# Patient Record
Sex: Male | Born: 1995 | Race: Black or African American | Hispanic: No | Marital: Single | Smoking: Never smoker
Health system: Southern US, Community
[De-identification: ages and names within clinical notes are randomized; demographics above are authoritative.]

## PROBLEM LIST (undated history)

## (undated) DIAGNOSIS — J45909 Unspecified asthma, uncomplicated: Secondary | ICD-10-CM

## (undated) DIAGNOSIS — D649 Anemia, unspecified: Secondary | ICD-10-CM

## (undated) DIAGNOSIS — K259 Gastric ulcer, unspecified as acute or chronic, without hemorrhage or perforation: Secondary | ICD-10-CM

## (undated) DIAGNOSIS — K269 Duodenal ulcer, unspecified as acute or chronic, without hemorrhage or perforation: Secondary | ICD-10-CM

## (undated) DIAGNOSIS — K219 Gastro-esophageal reflux disease without esophagitis: Secondary | ICD-10-CM

## (undated) DIAGNOSIS — K56609 Unspecified intestinal obstruction, unspecified as to partial versus complete obstruction: Secondary | ICD-10-CM

## (undated) DIAGNOSIS — K5010 Crohn's disease of large intestine without complications: Secondary | ICD-10-CM

## (undated) HISTORY — DX: Crohn's disease of large intestine without complications: K50.10

## (undated) HISTORY — DX: Gastric ulcer, unspecified as acute or chronic, without hemorrhage or perforation: K25.9

## (undated) HISTORY — DX: Unspecified intestinal obstruction, unspecified as to partial versus complete obstruction: K56.609

## (undated) HISTORY — DX: Gastro-esophageal reflux disease without esophagitis: K21.9

## (undated) HISTORY — DX: Duodenal ulcer, unspecified as acute or chronic, without hemorrhage or perforation: K26.9

## (undated) HISTORY — PX: COLON RESECTION: SHX5231

## (undated) HISTORY — DX: Anemia, unspecified: D64.9

## (undated) HISTORY — DX: Unspecified asthma, uncomplicated: J45.909

---
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
## 2017-01-22 DIAGNOSIS — K200 Eosinophilic esophagitis: Secondary | ICD-10-CM | POA: Insufficient documentation

## 2017-03-10 ENCOUNTER — Encounter: Payer: Self-pay | Admitting: Physician Assistant

## 2017-03-10 ENCOUNTER — Other Ambulatory Visit: Payer: Self-pay

## 2017-03-10 ENCOUNTER — Ambulatory Visit (INDEPENDENT_AMBULATORY_CARE_PROVIDER_SITE_OTHER): Payer: BC Managed Care – PPO | Admitting: Physician Assistant

## 2017-03-10 VITALS — BP 112/68 | HR 76 | Temp 98.0°F | Resp 16 | Ht 66.0 in | Wt 166.8 lb

## 2017-03-10 DIAGNOSIS — R0989 Other specified symptoms and signs involving the circulatory and respiratory systems: Secondary | ICD-10-CM

## 2017-03-10 DIAGNOSIS — Z8709 Personal history of other diseases of the respiratory system: Secondary | ICD-10-CM

## 2017-03-10 DIAGNOSIS — J4521 Mild intermittent asthma with (acute) exacerbation: Secondary | ICD-10-CM | POA: Diagnosis not present

## 2017-03-10 MED ORDER — ALBUTEROL SULFATE (2.5 MG/3ML) 0.083% IN NEBU: 2.5000 mg | INHALATION_SOLUTION | Freq: Four times a day (QID) | RESPIRATORY_TRACT | 1 refills | Status: DC | PRN

## 2017-03-10 MED ORDER — IPRATROPIUM BROMIDE 0.02 % IN SOLN
0.5000 mg | Freq: Once | RESPIRATORY_TRACT | Status: AC
  Administered 2017-03-10: 0.5 mg via RESPIRATORY_TRACT

## 2017-03-10 MED ORDER — ALBUTEROL SULFATE (2.5 MG/3ML) 0.083% IN NEBU
2.5000 mg | INHALATION_SOLUTION | Freq: Once | RESPIRATORY_TRACT | Status: AC
  Administered 2017-03-10: 2.5 mg via RESPIRATORY_TRACT

## 2017-03-10 MED ORDER — PREDNISONE 50 MG PO TABS: ORAL_TABLET | ORAL | 0 refills | Status: DC

## 2017-03-10 NOTE — Progress Notes (Signed)
03/10/2017 10:32 AM   DOB: 1996-01-10 / MRN: [id number]  SUBJECTIVE:  Jacob Mack is a 22 y.o. male presenting for cough, nasal congestion for the last 3 days.  I had recently seen his brother in the clinic for similar symptoms.  Patient has history of asthma with no previous hospitalization.  Complains of some mild chest tightness along with the cough.  Has been using his albuterol nebulizer twice daily over the last 3 days with good relief of chest tightness.  He is a nebulizer at this time.  He is a never smoker.  He is allergic to augmentin [amoxicillin-pot clavulanate].   He  has a past medical history of Crohn's colitis ([place]).    He  reports that  has never smoked. he has never used smokeless tobacco. He reports that he does not drink alcohol or use drugs. He  has no sexual activity history on file. The patient  has no past surgical history on file.  His family history includes Diabetes in his father and mother.  Review of Systems  Constitutional: Negative for chills, diaphoresis and fever.  HENT: Negative for sore throat.   Respiratory: Positive for cough and shortness of breath. Negative for hemoptysis, sputum production and wheezing.   Cardiovascular: Negative for chest pain, orthopnea and leg swelling.  Gastrointestinal: Negative for nausea.  Skin: Negative for rash.  Neurological: Negative for dizziness.    The problem list and medications were reviewed and updated by myself where necessary and exist elsewhere in the encounter.   OBJECTIVE:  BP 112/68 (BP Location: Right Arm, Patient Position: Sitting, Cuff Size: Normal)   Pulse 76   Temp 98 F (36.7 C) (Oral)   Resp 16   Ht 5' 6"  (1.676 m)   Wt 166 lb 12.8 oz (75.7 kg)   SpO2 94%   PF 300 L/min   BMI 26.92 kg/m   Physical Exam  Constitutional: He appears well-developed. He is active and cooperative.  Non-toxic appearance.  Cardiovascular: Normal rate, regular rhythm, S1 normal, S2 normal, normal heart sounds,  intact distal pulses and normal pulses. Exam reveals no gallop and no friction rub.  No murmur heard. Pulmonary/Chest: Effort normal. No stridor. No tachypnea. No respiratory distress. He has no wheezes. He has no rales.  Abdominal: He exhibits no distension.  Musculoskeletal: He exhibits no edema.  Neurological: He is alert.  Skin: Skin is warm and dry. He is not diaphoretic. No pallor.  Vitals reviewed.   No results found for this or any previous visit (from the past 72 hour(s)).  No results found.  ASSESSMENT AND PLAN:  Sasuke was seen today for cough.  Diagnoses and all orders for this visit:  Abnormally low peak expiratory flow rate: Despite negative wheezing on exam he has had a increase of roughly 150 L/min after nebs.  I will refill his nebs and start him on some prednisone.  He has no history of diabetes. -     albuterol (PROVENTIL) (2.5 MG/3ML) 0.083% nebulizer solution 2.5 mg -     ipratropium (ATROVENT) nebulizer solution 0.5 mg  History of asthma  Mild intermittent asthma with acute exacerbation -     predniSONE (DELTASONE) 50 MG tablet; Take one tablet daily for the next 4 days. -     albuterol (PROVENTIL) (2.5 MG/3ML) 0.083% nebulizer solution; Take 3 mLs (2.5 mg total) by nebulization every 6 (six) hours as needed for wheezing or shortness of breath.    The patient is advised to  call or return to clinic if he does not see an improvement in symptoms, or to seek the care of the closest emergency department if he worsens with the above plan.   Philis Fendt, MHS, PA-C [facility] 03/10/2017 10:32 AM

## 2017-03-10 NOTE — Patient Instructions (Addendum)
  You had an excellent response to the nebulizers in the office.  I think he would benefit from some prednisone.   IF you received an x-ray today, you will receive an invoice from [facility]. Please contact [facility] at [phone] with questions or concerns regarding your invoice.   IF you received labwork today, you will receive an invoice from [place]. Please contact [facility] at [phone] with questions or concerns regarding your invoice.   Our billing staff will not be able to assist you with questions regarding bills from these companies.  You will be contacted with the lab results as soon as they are available. The fastest way to get your results is to activate your My Chart account. Instructions are located on the last page of this paperwork. If you have not heard from [place] regarding the results in 2 weeks, please contact this office.

## 2017-06-25 ENCOUNTER — Encounter: Payer: BC Managed Care – PPO | Admitting: Physician Assistant

## 2017-08-09 ENCOUNTER — Ambulatory Visit: Payer: BC Managed Care – PPO | Admitting: Physician Assistant

## 2017-08-09 ENCOUNTER — Encounter: Payer: Self-pay | Admitting: Physician Assistant

## 2017-08-09 ENCOUNTER — Ambulatory Visit (INDEPENDENT_AMBULATORY_CARE_PROVIDER_SITE_OTHER): Payer: BC Managed Care – PPO

## 2017-08-09 ENCOUNTER — Other Ambulatory Visit: Payer: Self-pay

## 2017-08-09 VITALS — BP 115/78 | HR 51 | Temp 97.6°F | Ht 66.0 in | Wt 169.0 lb

## 2017-08-09 DIAGNOSIS — R0789 Other chest pain: Secondary | ICD-10-CM | POA: Diagnosis not present

## 2017-08-09 DIAGNOSIS — M791 Myalgia, unspecified site: Secondary | ICD-10-CM

## 2017-08-09 DIAGNOSIS — R001 Bradycardia, unspecified: Secondary | ICD-10-CM | POA: Diagnosis not present

## 2017-08-09 LAB — POCT URINALYSIS DIP (MANUAL ENTRY)
BILIRUBIN UA: NEGATIVE
BILIRUBIN UA: NEGATIVE mg/dL
GLUCOSE UA: NEGATIVE mg/dL
Leukocytes, UA: NEGATIVE
Nitrite, UA: NEGATIVE
Protein Ur, POC: NEGATIVE mg/dL
RBC UA: NEGATIVE
SPEC GRAV UA: 1.02 (ref 1.010–1.025)
Urobilinogen, UA: 0.2 E.U./dL
pH, UA: 6 (ref 5.0–8.0)

## 2017-08-09 LAB — POCT CBC
GRANULOCYTE PERCENT: 63.3 % (ref 37–80)
HCT, POC: 45.5 % (ref 43.5–53.7)
HEMOGLOBIN: 14.9 g/dL (ref 14.1–18.1)
Lymph, poc: 2.3 (ref 0.6–3.4)
MCH: 28.6 pg (ref 27–31.2)
MCHC: 32.8 g/dL (ref 31.8–35.4)
MCV: 87.4 fL (ref 80–97)
MID (CBC): 0.7 (ref 0–0.9)
MPV: 6.4 fL (ref 0–99.8)
POC GRANULOCYTE: 5.2 (ref 2–6.9)
POC LYMPH PERCENT: 28.6 %L (ref 10–50)
POC MID %: 8.1 % (ref 0–12)
Platelet Count, POC: 461 10*3/uL — AB (ref 142–424)
RBC: 5.2 M/uL (ref 4.69–6.13)
RDW, POC: 12.7 %
WBC: 8.2 10*3/uL (ref 4.6–10.2)

## 2017-08-09 MED ORDER — DOXYCYCLINE HYCLATE 100 MG PO CAPS: 100.0000 mg | ORAL_CAPSULE | Freq: Two times a day (BID) | ORAL | 0 refills | Status: AC

## 2017-08-09 MED ORDER — NAPROXEN 500 MG PO TABS: 500.0000 mg | ORAL_TABLET | Freq: Two times a day (BID) | ORAL | 0 refills | Status: DC

## 2017-08-09 NOTE — Patient Instructions (Addendum)
   Start the antibiotic and NSAIDS.  If you become short of breath then go to the ED.    I have some tic illness panels out.    IF you received an x-ray today, you will receive an invoice from [facility]. Please contact [facility] at [phone] with questions or concerns regarding your invoice.   IF you received labwork today, you will receive an invoice from [place]. Please contact [facility] at [phone] with questions or concerns regarding your invoice.   Our billing staff will not be able to assist you with questions regarding bills from these companies.  You will be contacted with the lab results as soon as they are available. The fastest way to get your results is to activate your My Chart account. Instructions are located on the last page of this paperwork. If you have not heard from [place] regarding the results in 2 weeks, please contact this office.

## 2017-08-09 NOTE — Progress Notes (Signed)
08/09/2017 2:06 PM   DOB: October 14, 1995 / MRN: [id number]  SUBJECTIVE:  Jacob Mack is a 22 y.o. male presenting for myalgia. Symptoms present for a few days now and the problem is worsening. He has tried Ibuprofen which does help. Pain is located in all of the major muscle groups, but are paricularly bad in the left chest today which is worse when he takes a deep breath. Denies a history of DVT/PE. Non smoker.   Denies SOB, DOE, rash.  Has a history of Crohn and denies abdominal pain, diarrhea, constipation, bloody stools.   He is allergic to fish allergy; peanut oil; tree extract; and augmentin [amoxicillin-pot clavulanate].   He  has a past medical history of Crohn's colitis ([place]).    He  reports that he has never smoked. He has never used smokeless tobacco. He reports that he does not drink alcohol or use drugs. He  has no sexual activity history on file. The patient  has no past surgical history on file.  His family history includes Diabetes in his father and mother.  Review of Systems  Constitutional: Negative for chills, diaphoresis and fever.  Eyes: Negative.   Respiratory: Negative for cough, hemoptysis, sputum production, shortness of breath and wheezing.   Cardiovascular: Negative for chest pain, orthopnea and leg swelling.  Gastrointestinal: Negative for abdominal pain, blood in stool, constipation, diarrhea, heartburn, melena, nausea and vomiting.  Genitourinary: Negative for dysuria, flank pain, frequency, hematuria and urgency.  Skin: Negative for rash.  Neurological: Negative for dizziness, sensory change, speech change, focal weakness and headaches.    The problem list and medications were reviewed and updated by myself where necessary and exist elsewhere in the encounter.   OBJECTIVE:  BP 115/78 (BP Location: Left Arm, Patient Position: Sitting, Cuff Size: Normal)   Pulse (!) 51   Temp 97.6 F (36.4 C) (Oral)   Ht 5' 6"  (1.676 m)   Wt 169 lb (76.7 kg)   SpO2 97%    BMI 27.28 kg/m   Wt Readings from Last 3 Encounters:  08/09/17 169 lb (76.7 kg)  03/10/17 166 lb 12.8 oz (75.7 kg)   Temp Readings from Last 3 Encounters:  08/09/17 97.6 F (36.4 C) (Oral)  03/10/17 98 F (36.7 C) (Oral)   BP Readings from Last 3 Encounters:  08/09/17 115/78  03/10/17 112/68   Pulse Readings from Last 3 Encounters:  08/09/17 (!) 51  03/10/17 76    Physical Exam  Constitutional: He is oriented to person, place, and time. He appears well-developed. He is active.  Non-toxic appearance. He does not appear ill.  Eyes: Pupils are equal, round, and reactive to light. Conjunctivae and EOM are normal.  Cardiovascular: Normal rate, regular rhythm, S1 normal, S2 normal, normal heart sounds, intact distal pulses and normal pulses. Exam reveals no gallop and no friction rub.  No murmur heard. Pulmonary/Chest: Effort normal. No stridor. No respiratory distress. He has no wheezes. He has no rales. He exhibits tenderness.  Abdominal: Soft. Normal appearance and bowel sounds are normal. He exhibits no distension and no mass. There is no tenderness. There is no rigidity, no rebound, no guarding and no CVA tenderness. No hernia.  Musculoskeletal: Normal range of motion. He exhibits no edema.  Neurological: He is alert and oriented to person, place, and time. He has normal strength and normal reflexes. He is not disoriented. No cranial nerve deficit or sensory deficit. He exhibits normal muscle tone. Coordination and gait normal.  Skin: Skin  is warm and dry. No rash noted. He is not diaphoretic. No erythema. No pallor.  Psychiatric: He has a normal mood and affect. His behavior is normal.  Nursing note and vitals reviewed.   Results for orders placed or performed in visit on 08/09/17  POCT CBC  Result Value Ref Range   WBC 8.2 4.6 - 10.2 K/uL   Lymph, poc 2.3 0.6 - 3.4   POC LYMPH PERCENT 28.6 10 - 50 %L   MID (cbc) 0.7 0 - 0.9   POC MID % 8.1 0 - 12 %M   POC Granulocyte  5.2 2 - 6.9   Granulocyte percent 63.3 37 - 80 %G   RBC 5.20 4.69 - 6.13 M/uL   Hemoglobin 14.9 14.1 - 18.1 g/dL   HCT, POC 45.5 43.5 - 53.7 %   MCV 87.4 80 - 97 fL   MCH, POC 28.6 27 - 31.2 pg   MCHC 32.8 31.8 - 35.4 g/dL   RDW, POC 12.7 %   Platelet Count, POC 461 (A) 142 - 424 K/uL   MPV 6.4 0 - 99.8 fL  POCT urinalysis dipstick  Result Value Ref Range   Color, UA yellow yellow   Clarity, UA clear clear   Glucose, UA negative negative mg/dL   Bilirubin, UA negative negative   Ketones, POC UA negative negative mg/dL   Spec Grav, UA 1.020 1.010 - 1.025   Blood, UA negative negative   pH, UA 6.0 5.0 - 8.0   Protein Ur, POC negative negative mg/dL   Urobilinogen, UA 0.2 0.2 or 1.0 E.U./dL   Nitrite, UA Negative Negative   Leukocytes, UA Negative Negative   Dg Chest 2 View  Result Date: 08/09/2017 CLINICAL DATA:  LEFT-sided chest pain, worse with inspiration. EXAM: CHEST - 2 VIEW COMPARISON:  None. FINDINGS: The heart size and mediastinal contours are within normal limits. Both lungs are clear. The visualized skeletal structures are unremarkable. IMPRESSION: No active cardiopulmonary disease. Electronically Signed   By: Staci Righter M.D.   On: 08/09/2017 13:14      ASSESSMENT AND PLAN:  Jacob Mack was seen today for body pain.  Diagnoses and all orders for this visit:  Myalgia: Question viral process vs tic borne illness. RTC in about 1 week.  NSAIDS for now.  -     POCT CBC -     POCT urinalysis dipstick -     Renal Function Panel -     Sedimentation Rate -     doxycycline (VIBRAMYCIN) 100 MG capsule; Take 1 capsule (100 mg total) by mouth 2 (two) times daily for 10 days. -     Rickettsial Fever Group IgG/M -     Lyme Ab/Western Blot Reflex  Atypical chest pain: EKG normal aside from early repol. Chest rads normal. Low suspicion for DVT/PE/MI.  Negative for pneumoina.  He has chest TTP about the pec major.  -     EKG 12-Lead -     DG Chest 2 View; Future  Sinus  bradycardia  Other orders -     naproxen (NAPROSYN) 500 MG tablet; Take 1 tablet (500 mg total) by mouth 2 (two) times daily with a meal.    The patient is advised to call or return to clinic if he does not see an improvement in symptoms, or to seek the care of the closest emergency department if he worsens with the above plan.   Philis Fendt, MHS, PA-C [facility] 08/09/2017 2:06 PM

## 2017-08-10 LAB — RENAL FUNCTION PANEL
ALBUMIN: 4.2 g/dL (ref 3.5–5.5)
BUN/Creatinine Ratio: 11 (ref 9–20)
BUN: 11 mg/dL (ref 6–20)
CHLORIDE: 100 mmol/L (ref 96–106)
CO2: 23 mmol/L (ref 20–29)
Calcium: 9.8 mg/dL (ref 8.7–10.2)
Creatinine, Ser: 1 mg/dL (ref 0.76–1.27)
GFR, EST AFRICAN AMERICAN: 123 mL/min/{1.73_m2} (ref >59)
GFR, EST NON AFRICAN AMERICAN: 106 mL/min/{1.73_m2} (ref >59)
GLUCOSE: 89 mg/dL (ref 65–99)
PHOSPHORUS: 3.5 mg/dL (ref 2.5–4.5)
POTASSIUM: 4.5 mmol/L (ref 3.5–5.2)
Sodium: 141 mmol/L (ref 134–144)

## 2017-08-10 LAB — SEDIMENTATION RATE: Sed Rate: 2 mm/hr (ref 0–15)

## 2017-08-10 NOTE — Progress Notes (Signed)
Please add on CK.  Diagnosis myalgia.

## 2017-08-11 ENCOUNTER — Telehealth: Payer: Self-pay | Admitting: Physician Assistant

## 2017-08-11 LAB — LYME AB/WESTERN BLOT REFLEX: LYME DISEASE AB, QUANT, IGM: <0.8 index (ref 0.00–0.79)

## 2017-08-11 LAB — RICKETTSIAL FEVER GROUP IGG/M
Spotted Fever Group IgM: <1:64 {titer}
Typhus Fever Group IgM: <1:64 {titer}

## 2017-08-11 NOTE — Telephone Encounter (Signed)
Copied from [place] [id number]. Topic: Quick Communication - See Telephone Encounter >> Aug 11, 2017 10:17 AM Antonieta Iba C wrote: CRM for notification. See Telephone encounter for: 08/11/17.  Pt says that he is returning a call, not showing a CRM, please assist further.

## 2017-08-11 NOTE — Addendum Note (Signed)
Addended by: Benson Setting L on: 08/11/2017 12:26 PM   Modules accepted: Orders

## 2017-08-11 NOTE — Progress Notes (Signed)
Phone call to patient. Asked that he call us back with a progress update.  Labs, aside from elevated platelets are normal.  CK pending.

## 2017-08-11 NOTE — Progress Notes (Signed)
Jasmine please call him back and advise that he continue taking the doxy.

## 2017-08-12 LAB — SPECIMEN STATUS REPORT

## 2017-08-12 LAB — CK: Total CK: 174 U/L (ref 24–204)

## 2017-08-12 NOTE — Telephone Encounter (Signed)
Spoke to pt.  Note under labs - result note.  Advised to take all of doxy.

## 2017-10-03 HISTORY — PX: SMALL INTESTINE SURGERY: SHX150

## 2017-10-07 ENCOUNTER — Telehealth: Payer: Self-pay | Admitting: Family Medicine

## 2017-10-07 NOTE — Telephone Encounter (Signed)
Please see note below. 

## 2017-10-07 NOTE — Telephone Encounter (Signed)
This is a patient of Clarks.  He came by to have a handicapped form filled out.  He didn't know Clark had even left.  He says he needs it filled out as soon as possible. I have left this in your box at the nurse desk.  Call when ready to pick up [phone]

## 2017-10-11 NOTE — Telephone Encounter (Signed)
Form completed today. Please notify patient ready to pickup thanks

## 2017-12-21 ENCOUNTER — Ambulatory Visit: Payer: BC Managed Care – PPO | Admitting: Family Medicine

## 2018-01-11 ENCOUNTER — Telehealth: Payer: Self-pay

## 2018-01-11 NOTE — Telephone Encounter (Signed)
Received package from [facility] for pt for Stelara.  Call to pharmacy.  We have not ordered this medication for pt.  Per [facility], this was sent to [place] in error and courier will be sent for pick up.

## 2018-01-11 NOTE — Telephone Encounter (Signed)
Courier picked up Stelara to deliver to correct location.

## 2018-01-11 NOTE — Telephone Encounter (Signed)
Pt called re shipment of meds.  Advised that we did receive med and as it was not ordered by this clinic, shipping pharmacy was notified and courier expected any time to pick up medication and take to correct location.

## 2018-01-18 NOTE — Telephone Encounter (Signed)
Otila Kluver RN with Pediatric GI @ [facility] hospital called and wanted to know if he could come in for just a nurse visit as she said that he has done this before for the sub Q injection. She said that [facility] has the pre-filled syringe, it was mailed to him. She said that he has already had the first dose of this. She said that he really just needs education on how to administer this and she has 8 weeks to provide that information to him. She said she will fax over the order/script to the office. She really would like to speak with Chanda. [phone]

## 2018-01-20 NOTE — Telephone Encounter (Signed)
Spoke with Otila Kluver at [facility].  Advised that pt does not appear to be established patient with the clinic and we did not prescribe medication.  Would not be able to provide education on the medication.  Otila Kluver will follow up with patient.

## 2018-04-11 ENCOUNTER — Ambulatory Visit: Payer: BC Managed Care – PPO | Admitting: Emergency Medicine

## 2018-04-11 ENCOUNTER — Encounter: Payer: Self-pay | Admitting: Emergency Medicine

## 2018-04-11 ENCOUNTER — Other Ambulatory Visit: Payer: Self-pay

## 2018-04-11 VITALS — BP 109/71 | HR 60 | Temp 98.5°F | Resp 18 | Ht 66.0 in | Wt 167.2 lb

## 2018-04-11 DIAGNOSIS — L309 Dermatitis, unspecified: Secondary | ICD-10-CM | POA: Diagnosis not present

## 2018-04-11 DIAGNOSIS — R21 Rash and other nonspecific skin eruption: Secondary | ICD-10-CM | POA: Diagnosis not present

## 2018-04-11 DIAGNOSIS — J069 Acute upper respiratory infection, unspecified: Secondary | ICD-10-CM | POA: Diagnosis not present

## 2018-04-11 MED ORDER — TRIAMCINOLONE ACETONIDE 0.1 % EX CREA: 1.0000 "application " | TOPICAL_CREAM | Freq: Two times a day (BID) | CUTANEOUS | 0 refills | Status: DC

## 2018-04-11 NOTE — Progress Notes (Signed)
Jacob Mack 23 y.o.   Chief Complaint  Patient presents with  . Cough    started yesterday and has an itchy throat   . Rash    x2weeks and has ezcema cream but its no longer working mostly on left side of neck but some on right that goes down to chest.     HISTORY OF PRESENT ILLNESS: This is a 23 y.o. male complaining of itchy throat and cough that started yesterday. Also complaining of rash to his neck that started 2 weeks ago. No other complaints or medical concerns today. No recent traveling or exposure to coronavirus patients.  HPI   Prior to Admission medications   Medication Sig Start Date End Date Taking? Authorizing Provider  albuterol (PROVENTIL) (2.5 MG/3ML) 0.083% nebulizer solution Take 3 mLs (2.5 mg total) by nebulization every 6 (six) hours as needed for wheezing or shortness of breath. 03/10/17  Yes Tereasa Coop, PA-C  cetirizine (ZYRTEC) 10 MG tablet Take 10 mg by mouth daily.   Yes [provider]  montelukast (SINGULAIR) 10 MG tablet Take 10 mg by mouth at bedtime.   Yes [provider]  naproxen (NAPROSYN) 500 MG tablet Take 1 tablet (500 mg total) by mouth 2 (two) times daily with a meal. 08/09/17  Yes Tereasa Coop, PA-C  omeprazole (PRILOSEC) 40 MG capsule Take 40 mg by mouth daily.   Yes [provider]  ustekinumab (STELARA) 90 MG/ML SOSY injection Inject into the skin. 11/01/17  Yes [provider]    Allergies  Allergen Reactions  . Fish Allergy Anaphylaxis  . Peanut Oil Anaphylaxis and Swelling  . Tree Extract Swelling  . Augmentin [Amoxicillin-Pot Clavulanate] Nausea And Vomiting    There are no active problems to display for this patient.   Past Medical History:  Diagnosis Date  . Crohn's colitis ([place])       Social History   Socioeconomic History  . Marital status: Single    Spouse name: Not on file  . Number of children: Not on file  . Years of education: Not on file  . Highest education  level: Not on file  Occupational History  . Not on file  Social Needs  . Financial resource strain: Not on file  . Food insecurity:    Worry: Not on file    Inability: Not on file  . Transportation needs:    Medical: Not on file    Non-medical: Not on file  Tobacco Use  . Smoking status: Never Smoker  . Smokeless tobacco: Never Used  Substance and Sexual Activity  . Alcohol use: No    Frequency: Never  . Drug use: No  . Sexual activity: Not on file  Lifestyle  . Physical activity:    Days per week: Not on file    Minutes per session: Not on file  . Stress: Not on file  Relationships  . Social connections:    Talks on phone: Not on file    Gets together: Not on file    Attends religious service: Not on file    Active member of club or organization: Not on file    Attends meetings of clubs or organizations: Not on file    Relationship status: Not on file  . Intimate partner violence:    Fear of current or ex partner: Not on file    Emotionally abused: Not on file    Physically abused: Not on file    Forced sexual activity: Not  on file  Other Topics Concern  . Not on file  Social History Narrative  . Not on file    Family History  Problem Relation Age of Onset  . Diabetes Mother   . Diabetes Father      Review of Systems  Constitutional: Negative.   HENT: Positive for sore throat.   Eyes: Negative.   Respiratory: Positive for cough.   Cardiovascular: Negative.   Gastrointestinal: Negative.   Genitourinary: Negative.   Musculoskeletal: Negative.   Skin: Positive for rash.  Neurological: Negative.   Endo/Heme/Allergies: Negative.   All other systems reviewed and are negative.   Vitals:   04/11/18 1128  BP: 109/71  Pulse: 60  Resp: 18  Temp: 98.5 F (36.9 C)  SpO2: 95%    Physical Exam Vitals signs reviewed.  Constitutional:      Appearance: Normal appearance.  HENT:     Head: Normocephalic and atraumatic.     Nose: Nose normal.      Mouth/Throat:     Mouth: Mucous membranes are moist.     Pharynx: Oropharynx is clear. No oropharyngeal exudate or posterior oropharyngeal erythema.  Eyes:     Extraocular Movements: Extraocular movements intact.     Conjunctiva/sclera: Conjunctivae normal.     Pupils: Pupils are equal, round, and reactive to light.  Cardiovascular:     Rate and Rhythm: Normal rate and regular rhythm.     Pulses: Normal pulses.     Heart sounds: Normal heart sounds.  Pulmonary:     Effort: Pulmonary effort is normal.     Breath sounds: Normal breath sounds.  Musculoskeletal: Normal range of motion.  Skin:    General: Skin is warm and dry.     Capillary Refill: Capillary refill takes less than 2 seconds.     Findings: Rash (Positive eczema to both sides of his neck left more than right) present.  Neurological:     General: No focal deficit present.     Mental Status: He is alert and oriented to person, place, and time.  Psychiatric:        Mood and Affect: Mood normal.        Behavior: Behavior normal.     A total of 25 minutes was spent in the room with the patient, greater than 50% of which was in counseling/coordination of care regarding differential diagnosis, treatment, medications, prognosis, and need for follow-up if no better or worse.   ASSESSMENT & PLAN: Jacob Mack was seen today for cough and rash.  Diagnoses and all orders for this visit:  Acute upper respiratory infection  Rash and nonspecific skin eruption -     triamcinolone cream (KENALOG) 0.1 %; Apply 1 application topically 2 (two) times daily.  Eczema, unspecified type -     triamcinolone cream (KENALOG) 0.1 %; Apply 1 application topically 2 (two) times daily.    Patient Instructions       If you have lab work done today you will be contacted with your lab results within the next 2 weeks.  If you have not heard from [place] then please contact us. The fastest way to get your results is to register for My Chart.   IF you  received an x-ray today, you will receive an invoice from [facility]. Please contact [facility] at [phone] with questions or concerns regarding your invoice.   IF you received labwork today, you will receive an invoice from [place]. Please contact [facility] at [phone] with questions or concerns  regarding your invoice.   Our billing staff will not be able to assist you with questions regarding bills from these companies.  You will be contacted with the lab results as soon as they are available. The fastest way to get your results is to activate your My Chart account. Instructions are located on the last page of this paperwork. If you have not heard from [place] regarding the results in 2 weeks, please contact this office.     Eczema Eczema is a broad term for a group of skin conditions that cause skin to become rough and inflamed. Each type of eczema has different triggers, symptoms, and treatments. Eczema of any type is usually itchy and symptoms range from mild to severe. Eczema and its symptoms are not spread from person to person (are not contagious). It can appear on different parts of the body at different times. Your eczema may not look the same as someone else's eczema. What are the types of eczema? Atopic dermatitis This is a long-term (chronic) skin disease that keeps coming back (recurring). Usual symptoms are dry skin and small, solid pimples that may swell and leak fluid (weep). Contact dermatitis  This happens when something irritates the skin and causes a rash. The irritation can come from substances that you are allergic to (allergens), such as poison ivy, chemicals, or medicines that were applied to your skin. Dyshidrotic eczema This is a form of eczema on the hands and feet. It shows up as very itchy, fluid-filled blisters. It can affect people of any age, but is more common before age 35. Hand eczema  This causes very itchy areas of skin on the palms  and sides of the hands and fingers. This type of eczema is common in industrial jobs where you may be exposed to many different types of irritants. Lichen simplex chronicus This type of eczema occurs when a person constantly scratches one area of the body. Repeated scratching of the area leads to thickened skin (lichenification). Lichen simplex chronicus can occur along with other types of eczema. It is more common in adults, but may be seen in children as well. Nummular eczema This is a common type of eczema. It has no known cause. It typically causes a red, circular, crusty lesion (plaque) that may be itchy. Scratching may become a habit and can cause bleeding. Nummular eczema occurs most often in people of middle-age or older. It most often affects the hands. Seborrheic dermatitis This is a common skin disease that mainly affects the scalp. It may also affect any oily areas of the body, such as the face, sides of nose, eyebrows, ears, eyelids, and chest. It is marked by small scaling and redness of the skin (erythema). This can affect people of all ages. In infants, this condition is known as Chartered certified accountant." Stasis dermatitis This is a common skin disease that usually appears on the legs and feet. It most often occurs in people who have a condition that prevents blood from being pumped through the veins in the legs (chronic venous insufficiency). Stasis dermatitis is a chronic condition that needs long-term management. How is eczema diagnosed? Your health care provider will examine your skin and review your medical history. He or she may also give you skin patch tests. These tests involve taking patches that contain possible allergens and placing them on your back. He or she will then check in a few days to see if an allergic reaction occurred. What are the common treatments? Treatment for  eczema is based on the type of eczema you have. Hydrocortisone steroid medicine can relieve itching quickly and  help reduce inflammation. This medicine may be prescribed or obtained over-the-counter, depending on the strength of the medicine that is needed. Follow these instructions at home:  Take over-the-counter and prescription medicines only as told by your health care provider.  Use creams or ointments to moisturize your skin. Do not use lotions.  Learn what triggers or irritates your symptoms. Avoid these things.  Treat symptom flare-ups quickly.  Do not itch your skin. This can make your rash worse.  Keep all follow-up visits as told by your health care provider. This is important. Where to find more information  The American Academy of Dermatology: http://jones-macias.info/  The National Eczema Association: www.nationaleczema.org Contact a health care provider if:  You have serious itching, even with treatment.  You regularly scratch your skin until it bleeds.  Your rash looks different than usual.  Your skin is painful, swollen, or more red than usual.  You have a fever. Summary  There are eight general types of eczema. Each type has different triggers.  Eczema of any type causes itching that may range from mild to severe.  Treatment varies based on the type of eczema you have. Hydrocortisone steroid medicine can help with itching and inflammation.  Protecting your skin is the best way to prevent eczema. Use moisturizers and lotions. Avoid triggers and irritants, and treat flare-ups quickly. This information is not intended to replace advice given to you by your health care provider. Make sure you discuss any questions you have with your health care provider. Document Released: 06/04/2016 Document Revised: 06/04/2016 Document Reviewed: 06/04/2016 Elsevier Interactive Patient Education  2019 [place].  Upper Respiratory Infection, Adult An upper respiratory infection (URI) affects the nose, throat, and upper air passages. URIs are caused by germs (viruses). The most common type of  URI is often called "the common cold." Medicines cannot cure URIs, but you can do things at home to relieve your symptoms. URIs usually get better within 7-10 days. Follow these instructions at home: Activity  Rest as needed.  If you have a fever, stay home from work or school until your fever is gone, or until your doctor says you may return to work or school. ? You should stay home until you cannot spread the infection anymore (you are not contagious). ? Your doctor may have you wear a face mask so you have less risk of spreading the infection. Relieving symptoms  Gargle with a salt-water mixture 3-4 times a day or as needed. To make a salt-water mixture, completely dissolve -1 tsp of salt in 1 cup of warm water.  Use a cool-mist humidifier to add moisture to the air. This can help you breathe more easily. Eating and drinking   Drink enough fluid to keep your pee (urine) pale yellow.  Eat soups and other clear broths. General instructions   Take over-the-counter and prescription medicines only as told by your doctor. These include cold medicines, fever reducers, and cough suppressants.  Do not use any products that contain nicotine or tobacco. These include cigarettes and e-cigarettes. If you need help quitting, ask your doctor.  Avoid being where people are smoking (avoid secondhand smoke).  Make sure you get regular shots and get the flu shot every year.  Keep all follow-up visits as told by your doctor. This is important. How to avoid spreading infection to others   Wash your hands often with  soap and water. If you do not have soap and water, use hand sanitizer.  Avoid touching your mouth, face, eyes, or nose.  Cough or sneeze into a tissue or your sleeve or elbow. Do not cough or sneeze into your hand or into the air. Contact a doctor if:  You are getting worse, not better.  You have any of these: ? A fever. ? Chills. ? Brown or red mucus in your nose. ? Yellow  or brown fluid (discharge)coming from your nose. ? Pain in your face, especially when you bend forward. ? Swollen neck glands. ? Pain with swallowing. ? White areas in the back of your throat. Get help right away if:  You have shortness of breath that gets worse.  You have very bad or constant: ? Headache. ? Ear pain. ? Pain in your forehead, behind your eyes, and over your cheekbones (sinus pain). ? Chest pain.  You have long-lasting (chronic) lung disease along with any of these: ? Wheezing. ? Long-lasting cough. ? Coughing up blood. ? A change in your usual mucus.  You have a stiff neck.  You have changes in your: ? Vision. ? Hearing. ? Thinking. ? Mood. Summary  An upper respiratory infection (URI) is caused by a germ called a virus. The most common type of URI is often called "the common cold."  URIs usually get better within 7-10 days.  Take over-the-counter and prescription medicines only as told by your doctor. This information is not intended to replace advice given to you by your health care provider. Make sure you discuss any questions you have with your health care provider. Document Released: 07/08/2007 Document Revised: 09/11/2016 Document Reviewed: 09/11/2016 Elsevier Interactive Patient Education  2019 Elsevier Inc.      Agustina Caroli, MD [facility]

## 2018-04-11 NOTE — Patient Instructions (Addendum)
If you have lab work done today you will be contacted with your lab results within the next 2 weeks.  If you have not heard from [place] then please contact us. The fastest way to get your results is to register for My Chart.   IF you received an x-ray today, you will receive an invoice from [facility]. Please contact [facility] at [phone] with questions or concerns regarding your invoice.   IF you received labwork today, you will receive an invoice from [place]. Please contact [facility] at [phone] with questions or concerns regarding your invoice.   Our billing staff will not be able to assist you with questions regarding bills from these companies.  You will be contacted with the lab results as soon as they are available. The fastest way to get your results is to activate your My Chart account. Instructions are located on the last page of this paperwork. If you have not heard from [place] regarding the results in 2 weeks, please contact this office.     Eczema Eczema is a broad term for a group of skin conditions that cause skin to become rough and inflamed. Each type of eczema has different triggers, symptoms, and treatments. Eczema of any type is usually itchy and symptoms range from mild to severe. Eczema and its symptoms are not spread from person to person (are not contagious). It can appear on different parts of the body at different times. Your eczema may not look the same as someone else's eczema. What are the types of eczema? Atopic dermatitis This is a long-term (chronic) skin disease that keeps coming back (recurring). Usual symptoms are dry skin and small, solid pimples that may swell and leak fluid (weep). Contact dermatitis  This happens when something irritates the skin and causes a rash. The irritation can come from substances that you are allergic to (allergens), such as poison ivy, chemicals, or medicines that were applied to your  skin. Dyshidrotic eczema This is a form of eczema on the hands and feet. It shows up as very itchy, fluid-filled blisters. It can affect people of any age, but is more common before age 70. Hand eczema  This causes very itchy areas of skin on the palms and sides of the hands and fingers. This type of eczema is common in industrial jobs where you may be exposed to many different types of irritants. Lichen simplex chronicus This type of eczema occurs when a person constantly scratches one area of the body. Repeated scratching of the area leads to thickened skin (lichenification). Lichen simplex chronicus can occur along with other types of eczema. It is more common in adults, but may be seen in children as well. Nummular eczema This is a common type of eczema. It has no known cause. It typically causes a red, circular, crusty lesion (plaque) that may be itchy. Scratching may become a habit and can cause bleeding. Nummular eczema occurs most often in people of middle-age or older. It most often affects the hands. Seborrheic dermatitis This is a common skin disease that mainly affects the scalp. It may also affect any oily areas of the body, such as the face, sides of nose, eyebrows, ears, eyelids, and chest. It is marked by small scaling and redness of the skin (erythema). This can affect people of all ages. In infants, this condition is known as Chartered certified accountant." Stasis dermatitis This is a common skin disease that usually appears on the legs and feet. It  most often occurs in people who have a condition that prevents blood from being pumped through the veins in the legs (chronic venous insufficiency). Stasis dermatitis is a chronic condition that needs long-term management. How is eczema diagnosed? Your health care provider will examine your skin and review your medical history. He or she may also give you skin patch tests. These tests involve taking patches that contain possible allergens and placing them  on your back. He or she will then check in a few days to see if an allergic reaction occurred. What are the common treatments? Treatment for eczema is based on the type of eczema you have. Hydrocortisone steroid medicine can relieve itching quickly and help reduce inflammation. This medicine may be prescribed or obtained over-the-counter, depending on the strength of the medicine that is needed. Follow these instructions at home:  Take over-the-counter and prescription medicines only as told by your health care provider.  Use creams or ointments to moisturize your skin. Do not use lotions.  Learn what triggers or irritates your symptoms. Avoid these things.  Treat symptom flare-ups quickly.  Do not itch your skin. This can make your rash worse.  Keep all follow-up visits as told by your health care provider. This is important. Where to find more information  The American Academy of Dermatology: http://jones-macias.info/  The National Eczema Association: www.nationaleczema.org Contact a health care provider if:  You have serious itching, even with treatment.  You regularly scratch your skin until it bleeds.  Your rash looks different than usual.  Your skin is painful, swollen, or more red than usual.  You have a fever. Summary  There are eight general types of eczema. Each type has different triggers.  Eczema of any type causes itching that may range from mild to severe.  Treatment varies based on the type of eczema you have. Hydrocortisone steroid medicine can help with itching and inflammation.  Protecting your skin is the best way to prevent eczema. Use moisturizers and lotions. Avoid triggers and irritants, and treat flare-ups quickly. This information is not intended to replace advice given to you by your health care provider. Make sure you discuss any questions you have with your health care provider. Document Released: 06/04/2016 Document Revised: 06/04/2016 Document Reviewed:  06/04/2016 Elsevier Interactive Patient Education  2019 [place].  Upper Respiratory Infection, Adult An upper respiratory infection (URI) affects the nose, throat, and upper air passages. URIs are caused by germs (viruses). The most common type of URI is often called "the common cold." Medicines cannot cure URIs, but you can do things at home to relieve your symptoms. URIs usually get better within 7-10 days. Follow these instructions at home: Activity  Rest as needed.  If you have a fever, stay home from work or school until your fever is gone, or until your doctor says you may return to work or school. ? You should stay home until you cannot spread the infection anymore (you are not contagious). ? Your doctor may have you wear a face mask so you have less risk of spreading the infection. Relieving symptoms  Gargle with a salt-water mixture 3-4 times a day or as needed. To make a salt-water mixture, completely dissolve -1 tsp of salt in 1 cup of warm water.  Use a cool-mist humidifier to add moisture to the air. This can help you breathe more easily. Eating and drinking   Drink enough fluid to keep your pee (urine) pale yellow.  Eat soups and other clear  broths. General instructions   Take over-the-counter and prescription medicines only as told by your doctor. These include cold medicines, fever reducers, and cough suppressants.  Do not use any products that contain nicotine or tobacco. These include cigarettes and e-cigarettes. If you need help quitting, ask your doctor.  Avoid being where people are smoking (avoid secondhand smoke).  Make sure you get regular shots and get the flu shot every year.  Keep all follow-up visits as told by your doctor. This is important. How to avoid spreading infection to others   Wash your hands often with soap and water. If you do not have soap and water, use hand sanitizer.  Avoid touching your mouth, face, eyes, or nose.  Cough  or sneeze into a tissue or your sleeve or elbow. Do not cough or sneeze into your hand or into the air. Contact a doctor if:  You are getting worse, not better.  You have any of these: ? A fever. ? Chills. ? Brown or red mucus in your nose. ? Yellow or brown fluid (discharge)coming from your nose. ? Pain in your face, especially when you bend forward. ? Swollen neck glands. ? Pain with swallowing. ? White areas in the back of your throat. Get help right away if:  You have shortness of breath that gets worse.  You have very bad or constant: ? Headache. ? Ear pain. ? Pain in your forehead, behind your eyes, and over your cheekbones (sinus pain). ? Chest pain.  You have long-lasting (chronic) lung disease along with any of these: ? Wheezing. ? Long-lasting cough. ? Coughing up blood. ? A change in your usual mucus.  You have a stiff neck.  You have changes in your: ? Vision. ? Hearing. ? Thinking. ? Mood. Summary  An upper respiratory infection (URI) is caused by a germ called a virus. The most common type of URI is often called "the common cold."  URIs usually get better within 7-10 days.  Take over-the-counter and prescription medicines only as told by your doctor. This information is not intended to replace advice given to you by your health care provider. Make sure you discuss any questions you have with your health care provider. Document Released: 07/08/2007 Document Revised: 09/11/2016 Document Reviewed: 09/11/2016 Elsevier Interactive Patient Education  2019 [employer].

## 2018-05-02 ENCOUNTER — Other Ambulatory Visit: Payer: Self-pay | Admitting: Physician Assistant

## 2018-05-02 DIAGNOSIS — J4521 Mild intermittent asthma with (acute) exacerbation: Secondary | ICD-10-CM

## 2018-05-02 NOTE — Telephone Encounter (Signed)
Requested Prescriptions  Pending Prescriptions Disp Refills  . albuterol (PROVENTIL) (2.5 MG/3ML) 0.083% nebulizer solution [Pharmacy Med Name: ALBUTEROL 0.083%(2.5MG/3ML) 60X3ML] 180 mL 0    Sig: INHALE CONTENTS OF ONE NEBULE VIA NEBULIZER EVERY 6 HOURS AS NEEDED FOR WHEEZE OR SHORTNESS OF BREATH     Pulmonology:  Beta Agonists Failed - 05/02/2018  5:27 PM      Failed - One inhaler should last at least one month. If the patient is requesting refills earlier, contact the patient to check for uncontrolled symptoms.      Passed - Valid encounter within last 12 months    Recent Outpatient Visits          3 weeks ago Acute upper respiratory infection   [facility] at [facility], Ines Bloomer, MD   8 months ago Myalgia   Primary [facility] at [place], PA-C   1 year ago Abnormally low peak expiratory flow rate   [facility] Care at [place], PA-C

## 2018-05-06 ENCOUNTER — Other Ambulatory Visit: Payer: Self-pay | Admitting: Emergency Medicine

## 2018-05-07 MED ORDER — MONTELUKAST SODIUM 10 MG PO TABS: 10.0000 mg | ORAL_TABLET | Freq: Every day | ORAL | 0 refills | Status: DC

## 2018-11-15 ENCOUNTER — Encounter: Payer: Self-pay | Admitting: Emergency Medicine

## 2018-11-15 ENCOUNTER — Other Ambulatory Visit: Payer: Self-pay

## 2018-11-15 ENCOUNTER — Ambulatory Visit (INDEPENDENT_AMBULATORY_CARE_PROVIDER_SITE_OTHER): Payer: BC Managed Care – PPO | Admitting: Emergency Medicine

## 2018-11-15 VITALS — BP 100/64 | HR 71 | Temp 98.2°F | Resp 16 | Ht 66.0 in | Wt 178.4 lb

## 2018-11-15 DIAGNOSIS — Z1329 Encounter for screening for other suspected endocrine disorder: Secondary | ICD-10-CM | POA: Diagnosis not present

## 2018-11-15 DIAGNOSIS — Z13220 Encounter for screening for lipoid disorders: Secondary | ICD-10-CM

## 2018-11-15 DIAGNOSIS — Z8719 Personal history of other diseases of the digestive system: Secondary | ICD-10-CM

## 2018-11-15 DIAGNOSIS — Z130 Encounter for screening for diseases of the blood and blood-forming organs and certain disorders involving the immune mechanism: Secondary | ICD-10-CM

## 2018-11-15 DIAGNOSIS — Z23 Encounter for immunization: Secondary | ICD-10-CM

## 2018-11-15 DIAGNOSIS — Z0000 Encounter for general adult medical examination without abnormal findings: Secondary | ICD-10-CM

## 2018-11-15 DIAGNOSIS — Z13228 Encounter for screening for other metabolic disorders: Secondary | ICD-10-CM

## 2018-11-15 NOTE — Patient Instructions (Addendum)
   If you have lab work done today you will be contacted with your lab results within the next 2 weeks.  If you have not heard from us then please contact us. The fastest way to get your results is to register for My Chart.   IF you received an x-ray today, you will receive an invoice from [facility]. Please contact [facility] at [phone] with questions or concerns regarding your invoice.   IF you received labwork today, you will receive an invoice from [facility]. Please contact [facility] at [phone] with questions or concerns regarding your invoice.   Our billing staff will not be able to assist you with questions regarding bills from these companies.  You will be contacted with the lab results as soon as they are available. The fastest way to get your results is to activate your My Chart account. Instructions are located on the last page of this paperwork. If you have not heard from us regarding the results in 2 weeks, please contact this office.     Health Maintenance, Male Adopting a healthy lifestyle and getting preventive care are important in promoting health and wellness. Ask your health care provider about:  The right schedule for you to have regular tests and exams.  Things you can do on your own to prevent diseases and keep yourself healthy. What should I know about diet, weight, and exercise? Eat a healthy diet   Eat a diet that includes plenty of vegetables, fruits, low-fat dairy products, and lean protein.  Do not eat a lot of foods that are high in solid fats, added sugars, or sodium. Maintain a healthy weight Body mass index (BMI) is a measurement that can be used to identify possible weight problems. It estimates body fat based on height and weight. Your health care provider can help determine your BMI and help you achieve or maintain a healthy weight. Get regular exercise Get regular exercise. This is one of the most important things you  can do for your health. Most adults should:  Exercise for at least 150 minutes each week. The exercise should increase your heart rate and make you sweat (moderate-intensity exercise).  Do strengthening exercises at least twice a week. This is in addition to the moderate-intensity exercise.  Spend less time sitting. Even light physical activity can be beneficial. Watch cholesterol and blood lipids Have your blood tested for lipids and cholesterol at 24 years of age, then have this test every 5 years. You may need to have your cholesterol levels checked more often if:  Your lipid or cholesterol levels are high.  You are older than 24 years of age.  You are at high risk for heart disease. What should I know about cancer screening? Many types of cancers can be detected early and may often be prevented. Depending on your health history and family history, you may need to have cancer screening at various ages. This may include screening for:  Colorectal cancer.  Prostate cancer.  Skin cancer.  Lung cancer. What should I know about heart disease, diabetes, and high blood pressure? Blood pressure and heart disease  High blood pressure causes heart disease and increases the risk of stroke. This is more likely to develop in people who have high blood pressure readings, are of African descent, or are overweight.  Talk with your health care provider about your target blood pressure readings.  Have your blood pressure checked: ? Every 3-5 years if you are 18-39 years   of age. ? Every year if you are 40 years old or older.  If you are between the ages of 65 and 75 and are a current or former smoker, ask your health care provider if you should have a one-time screening for abdominal aortic aneurysm (AAA). Diabetes Have regular diabetes screenings. This checks your fasting blood sugar level. Have the screening done:  Once every three years after age 45 if you are at a normal weight and have  a low risk for diabetes.  More often and at a younger age if you are overweight or have a high risk for diabetes. What should I know about preventing infection? Hepatitis B If you have a higher risk for hepatitis B, you should be screened for this virus. Talk with your health care provider to find out if you are at risk for hepatitis B infection. Hepatitis C Blood testing is recommended for:  Everyone born from 1945 through 1965.  Anyone with known risk factors for hepatitis C. Sexually transmitted infections (STIs)  You should be screened each year for STIs, including gonorrhea and chlamydia, if: ? You are sexually active and are younger than 24 years of age. ? You are older than 24 years of age and your health care provider tells you that you are at risk for this type of infection. ? Your sexual activity has changed since you were last screened, and you are at increased risk for chlamydia or gonorrhea. Ask your health care provider if you are at risk.  Ask your health care provider about whether you are at high risk for HIV. Your health care provider may recommend a prescription medicine to help prevent HIV infection. If you choose to take medicine to prevent HIV, you should first get tested for HIV. You should then be tested every 3 months for as long as you are taking the medicine. Follow these instructions at home: Lifestyle  Do not use any products that contain nicotine or tobacco, such as cigarettes, e-cigarettes, and chewing tobacco. If you need help quitting, ask your health care provider.  Do not use street drugs.  Do not share needles.  Ask your health care provider for help if you need support or information about quitting drugs. Alcohol use  Do not drink alcohol if your health care provider tells you not to drink.  If you drink alcohol: ? Limit how much you have to 0-2 drinks a day. ? Be aware of how much alcohol is in your drink. In the [place] one drink equals one 12  oz bottle of beer (355 mL), one 5 oz glass of wine (148 mL), or one 1 oz glass of hard liquor (44 mL). General instructions  Schedule regular health, dental, and eye exams.  Stay current with your vaccines.  Tell your health care provider if: ? You often feel depressed. ? You have ever been abused or do not feel safe at home. Summary  Adopting a healthy lifestyle and getting preventive care are important in promoting health and wellness.  Follow your health care provider's instructions about healthy diet, exercising, and getting tested or screened for diseases.  Follow your health care provider's instructions on monitoring your cholesterol and blood pressure. This information is not intended to replace advice given to you by your health care provider. Make sure you discuss any questions you have with your health care provider. Document Released: 07/18/2007 Document Revised: 01/12/2018 Document Reviewed: 01/12/2018 Elsevier Patient Education  2020 Elsevier Inc.  

## 2018-11-15 NOTE — Progress Notes (Signed)
Jacob Mack 23 y.o.   Chief Complaint  Patient presents with  . Annual Exam    HISTORY OF PRESENT ILLNESS: This is a 23 y.o. male here for his annual exam. Has history of Crohn's disease, status post surgery September 2019.  No complaints. Healthy lifestyle. 10/29/2017 GI MD note: ASSESSMENT: Jacob Mack is a 23 y.o. male (DOB: 12/30/1995) who returns for Crohn's disease of the stomach, jejunum, ileum and colon, inflammatory type, diagnosed in March 2013.  1) Crohn's - started immediately on dual therapy with infliximab and low dose MTX due to severity and extent of disease. Did well clinically after this. Follow up scopes 08/20/15, showed persistent ileal inflammation, though improved from diagnosis. MTX increased to therapeutic dosing at that time; however, unable to tolerate this due to nausea and headache. Repeat scopes 04/12/17 showed persistent inflammation, increased IFX to 10 mg/kg. Developed sx of partial SBO 10/01/17, requiring admission. Underwent ileocectomy (~30 cm ileum) on 09/29/17 with Dr. Sharol Roussel. Recovered well, now cleared for immunosuppression. Because of progressive disease while on anti-TNF, will start Stelara. 2) EoE - had evidence of EoE by scopes in July 2017 and again in March 2018. Has had poor compliance with budesonide slurries. Does have some intermitted sensation of "lump" in throat, but is largely asymptomatic. Allergy following. On PPI. Because Jacob Mack was not compliant with budesonide, we also tried high dose swallowed fluticasone 880 mcg BID. 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 Discussed with Allergy, who has suggested gluten and dairy elimination. Will need repeat EGD to assess response. 3) Nutrition - no risk. 4) Growth - no risk.  5) Anemia - no anemia.  6) Health maintenance - Immunizations reviewed, flu shot and Qgold done Oct 2018. Stressed importance of keeping immunizations UTD, yearly ophtho exams, and nutritional/vitamin  supplements. Vitamin D levels low Oct 2018, on 50,000 IU weekly. 7) Psych - no active issues    HPI   Prior to Admission medications   Medication Sig Start Date End Date Taking? Authorizing Provider  albuterol (PROVENTIL) (2.5 MG/3ML) 0.083% nebulizer solution INHALE CONTENTS OF ONE NEBULE VIA NEBULIZER EVERY 6 HOURS AS NEEDED FOR WHEEZE OR SHORTNESS OF BREATH 05/02/18  Yes Lissett Favorite, Ines Bloomer, MD  cetirizine (ZYRTEC) 10 MG tablet Take 10 mg by mouth daily.   Yes [provider]  montelukast (SINGULAIR) 10 MG tablet Take 1 tablet (10 mg total) by mouth at bedtime. 05/07/18  Yes Arley Garant, Ines Bloomer, MD  ustekinumab [facility]) 90 MG/ML SOSY injection Inject into the skin. 11/01/17  Yes [provider]  naproxen (NAPROSYN) 500 MG tablet Take 1 tablet (500 mg total) by mouth 2 (two) times daily with a meal. Patient not taking: Reported on 11/15/2018 08/09/17   Tereasa Coop, PA-C  omeprazole (PRILOSEC) 40 MG capsule Take 40 mg by mouth daily.    [provider]  triamcinolone cream (KENALOG) 0.1 % Apply 1 application topically 2 (two) times daily. Patient not taking: Reported on 11/15/2018 04/11/18   Horald Pollen, MD    Allergies  Allergen Reactions  . Fish Allergy Anaphylaxis  . Peanut Oil Anaphylaxis and Swelling  . Tree Extract Swelling  . Augmentin [Amoxicillin-Pot Clavulanate] Nausea And Vomiting    There are no active problems to display for this patient.   Past Medical History:  Diagnosis Date  . Crohn's colitis ([place])     History reviewed. No pertinent surgical history.  Social History   Socioeconomic History  . Marital status: Single  Spouse name: Not on file  . Number of children: Not on file  . Years of education: Not on file  . Highest education level: Not on file  Occupational History  . Not on file  Social Needs  . Financial resource strain: Not on file  . Food insecurity    Worry: Not on file    Inability: Not on file   . Transportation needs    Medical: Not on file    Non-medical: Not on file  Tobacco Use  . Smoking status: Never Smoker  . Smokeless tobacco: Never Used  Substance and Sexual Activity  . Alcohol use: No    Frequency: Never  . Drug use: No  . Sexual activity: Not on file  Lifestyle  . Physical activity    Days per week: Not on file    Minutes per session: Not on file  . Stress: Not on file  Relationships  . Social Herbalist on phone: Not on file    Gets together: Not on file    Attends religious service: Not on file    Active member of club or organization: Not on file    Attends meetings of clubs or organizations: Not on file    Relationship status: Not on file  . Intimate partner violence    Fear of current or ex partner: Not on file    Emotionally abused: Not on file    Physically abused: Not on file    Forced sexual activity: Not on file  Other Topics Concern  . Not on file  Social History Narrative  . Not on file    Family History  Problem Relation Age of Onset  . Diabetes Mother   . Diabetes Father      Review of Systems  Constitutional: Negative.  Negative for chills, fever and weight loss.  HENT: Negative.  Negative for congestion and sore throat.   Respiratory: Negative.  Negative for cough and shortness of breath.   Cardiovascular: Positive for chest pain (Musculoskeletal).  Gastrointestinal: Negative.  Negative for abdominal pain, blood in stool, diarrhea, melena, nausea and vomiting.  Genitourinary: Negative.  Negative for dysuria and hematuria.  Musculoskeletal: Positive for back pain (Musculoskeletal).  Skin: Negative.  Negative for rash.  Neurological: Negative.  Negative for dizziness and headaches.  Endo/Heme/Allergies: Negative.   All other systems reviewed and are negative.  Vitals:   11/15/18 1019  BP: 100/64  Pulse: 71  Resp: 16  Temp: 98.2 F (36.8 C)  SpO2: 95%     Physical Exam Vitals signs reviewed.   Constitutional:      Appearance: Normal appearance.  HENT:     Head: Normocephalic.  Eyes:     Extraocular Movements: Extraocular movements intact.     Conjunctiva/sclera: Conjunctivae normal.     Pupils: Pupils are equal, round, and reactive to light.  Neck:     Musculoskeletal: Normal range of motion and neck supple.  Cardiovascular:     Rate and Rhythm: Normal rate and regular rhythm.     Pulses: Normal pulses.     Heart sounds: Normal heart sounds.  Pulmonary:     Effort: Pulmonary effort is normal.     Breath sounds: Normal breath sounds.  Chest:     Chest wall: No tenderness.  Abdominal:     General: There is no distension.     Palpations: Abdomen is soft. There is no mass.     Tenderness: There is no abdominal tenderness.  Musculoskeletal: Normal range of motion.     Right lower leg: No edema.     Left lower leg: No edema.  Skin:    General: Skin is warm and dry.     Capillary Refill: Capillary refill takes less than 2 seconds.  Neurological:     General: No focal deficit present.     Mental Status: He is alert and oriented to person, place, and time.  Psychiatric:        Mood and Affect: Mood normal.        Behavior: Behavior normal.      ASSESSMENT & PLAN: Braun was seen today for annual exam.  Diagnoses and all orders for this visit:  Routine general medical examination at a health care facility  Screening for deficiency anemia -     CBC with Differential  Screening for lipoid disorders -     Lipid panel  Screening for endocrine, metabolic and immunity disorder -     Comprehensive metabolic panel -     Hemoglobin A1c  History of Crohn's disease    Patient Instructions       If you have lab work done today you will be contacted with your lab results within the next 2 weeks.  If you have not heard from [place] then please contact us. The fastest way to get your results is to register for My Chart.   IF you received an x-ray today, you will  receive an invoice from [facility]. Please contact [facility] at [phone] with questions or concerns regarding your invoice.   IF you received labwork today, you will receive an invoice from [facility]. Please contact [facility] at [phone] with questions or concerns regarding your invoice.   Our billing staff will not be able to assist you with questions regarding bills from these companies.  You will be contacted with the lab results as soon as they are available. The fastest way to get your results is to activate your My Chart account. Instructions are located on the last page of this paperwork. If you have not heard from [place] regarding the results in 2 weeks, please contact this office.      Health Maintenance, Male Adopting a healthy lifestyle and getting preventive care are important in promoting health and wellness. Ask your health care provider about:  The right schedule for you to have regular tests and exams.  Things you can do on your own to prevent diseases and keep yourself healthy. What should I know about diet, weight, and exercise? Eat a healthy diet   Eat a diet that includes plenty of vegetables, fruits, low-fat dairy products, and lean protein.  Do not eat a lot of foods that are high in solid fats, added sugars, or sodium. Maintain a healthy weight Body mass index (BMI) is a measurement that can be used to identify possible weight problems. It estimates body fat based on height and weight. Your health care provider can help determine your BMI and help you achieve or maintain a healthy weight. Get regular exercise Get regular exercise. This is one of the most important things you can do for your health. Most adults should:  Exercise for at least 150 minutes each week. The exercise should increase your heart rate and make you sweat (moderate-intensity exercise).  Do strengthening exercises at least twice a week. This is in addition to the  moderate-intensity exercise.  Spend less time sitting. Even light physical activity can be beneficial. Watch cholesterol and  blood lipids Have your blood tested for lipids and cholesterol at 24 years of age, then have this test every 5 years. You may need to have your cholesterol levels checked more often if:  Your lipid or cholesterol levels are high.  You are older than 24 years of age.  You are at high risk for heart disease. What should I know about cancer screening? Many types of cancers can be detected early and may often be prevented. Depending on your health history and family history, you may need to have cancer screening at various ages. This may include screening for:  Colorectal cancer.  Prostate cancer.  Skin cancer.  Lung cancer. What should I know about heart disease, diabetes, and high blood pressure? Blood pressure and heart disease  High blood pressure causes heart disease and increases the risk of stroke. This is more likely to develop in people who have high blood pressure readings, are of African descent, or are overweight.  Talk with your health care provider about your target blood pressure readings.  Have your blood pressure checked: ? Every 3-5 years if you are 37-19 years of age. ? Every year if you are 1 years old or older.  If you are between the ages of 48 and 76 and are a current or former smoker, ask your health care provider if you should have a one-time screening for abdominal aortic aneurysm (AAA). Diabetes Have regular diabetes screenings. This checks your fasting blood sugar level. Have the screening done:  Once every three years after age 35 if you are at a normal weight and have a low risk for diabetes.  More often and at a younger age if you are overweight or have a high risk for diabetes. What should I know about preventing infection? Hepatitis B If you have a higher risk for hepatitis B, you should be screened for this virus. Talk  with your health care provider to find out if you are at risk for hepatitis B infection. Hepatitis C Blood testing is recommended for:  Everyone born from [birth year] through 1965.  Anyone with known risk factors for hepatitis C. Sexually transmitted infections (STIs)  You should be screened each year for STIs, including gonorrhea and chlamydia, if: ? You are sexually active and are younger than 24 years of age. ? You are older than 24 years of age and your health care provider tells you that you are at risk for this type of infection. ? Your sexual activity has changed since you were last screened, and you are at increased risk for chlamydia or gonorrhea. Ask your health care provider if you are at risk.  Ask your health care provider about whether you are at high risk for HIV. Your health care provider may recommend a prescription medicine to help prevent HIV infection. If you choose to take medicine to prevent HIV, you should first get tested for HIV. You should then be tested every 3 months for as long as you are taking the medicine. Follow these instructions at home: Lifestyle  Do not use any products that contain nicotine or tobacco, such as cigarettes, e-cigarettes, and chewing tobacco. If you need help quitting, ask your health care provider.  Do not use street drugs.  Do not share needles.  Ask your health care provider for help if you need support or information about quitting drugs. Alcohol use  Do not drink alcohol if your health care provider tells you not to drink.  If you drink alcohol: ?  Limit how much you have to 0-2 drinks a day. ? Be aware of how much alcohol is in your drink. In the [place] one drink equals one 12 oz bottle of beer (355 mL), one 5 oz glass of wine (148 mL), or one 1 oz glass of hard liquor (44 mL). General instructions  Schedule regular health, dental, and eye exams.  Stay current with your vaccines.  Tell your health care provider if: ? You often  feel depressed. ? You have ever been abused or do not feel safe at home. Summary  Adopting a healthy lifestyle and getting preventive care are important in promoting health and wellness.  Follow your health care provider's instructions about healthy diet, exercising, and getting tested or screened for diseases.  Follow your health care provider's instructions on monitoring your cholesterol and blood pressure. This information is not intended to replace advice given to you by your health care provider. Make sure you discuss any questions you have with your health care provider. Document Released: 07/18/2007 Document Revised: 01/12/2018 Document Reviewed: 01/12/2018 Elsevier Patient Education  2020 Elsevier Inc.      Agustina Caroli, MD [facility]

## 2018-11-16 LAB — CBC WITH DIFFERENTIAL/PLATELET
Basophils Absolute: 0 10*3/uL (ref 0.0–0.2)
Basos: 0 %
EOS (ABSOLUTE): 0.2 10*3/uL (ref 0.0–0.4)
Eos: 3 %
Hematocrit: 43.3 % (ref 37.5–51.0)
Hemoglobin: 14.7 g/dL (ref 13.0–17.7)
Immature Grans (Abs): 0 10*3/uL (ref 0.0–0.1)
Immature Granulocytes: 0 %
Lymphocytes Absolute: 1.6 10*3/uL (ref 0.7–3.1)
Lymphs: 23 %
MCH: 29.5 pg (ref 26.6–33.0)
MCHC: 33.9 g/dL (ref 31.5–35.7)
MCV: 87 fL (ref 79–97)
Monocytes Absolute: 0.6 10*3/uL (ref 0.1–0.9)
Monocytes: 9 %
Neutrophils Absolute: 4.3 10*3/uL (ref 1.4–7.0)
Neutrophils: 65 %
Platelets: 314 10*3/uL (ref 150–450)
RBC: 4.98 x10E6/uL (ref 4.14–5.80)
RDW: 13.1 % (ref 11.6–15.4)
WBC: 6.7 10*3/uL (ref 3.4–10.8)

## 2018-11-16 LAB — COMPREHENSIVE METABOLIC PANEL
ALT: 24 IU/L (ref 0–44)
AST: 27 IU/L (ref 0–40)
Albumin/Globulin Ratio: 1.4 (ref 1.2–2.2)
Albumin: 4.4 g/dL (ref 4.1–5.2)
Alkaline Phosphatase: 97 IU/L (ref 39–117)
BUN/Creatinine Ratio: 12 (ref 9–20)
BUN: 14 mg/dL (ref 6–20)
Bilirubin Total: 0.4 mg/dL (ref 0.0–1.2)
CO2: 24 mmol/L (ref 20–29)
Calcium: 9.6 mg/dL (ref 8.7–10.2)
Chloride: 107 mmol/L — ABNORMAL HIGH (ref 96–106)
Creatinine, Ser: 1.16 mg/dL (ref 0.76–1.27)
GFR calc Af Amer: 102 mL/min/{1.73_m2} (ref >59)
GFR calc non Af Amer: 88 mL/min/{1.73_m2} (ref >59)
Globulin, Total: 3.1 g/dL (ref 1.5–4.5)
Glucose: 95 mg/dL (ref 65–99)
Potassium: 4.3 mmol/L (ref 3.5–5.2)
Sodium: 138 mmol/L (ref 134–144)
Total Protein: 7.5 g/dL (ref 6.0–8.5)

## 2018-11-16 LAB — LIPID PANEL
Chol/HDL Ratio: 3 ratio (ref 0.0–5.0)
Cholesterol, Total: 163 mg/dL (ref 100–199)
HDL: 54 mg/dL (ref >39)
LDL Chol Calc (NIH): 97 mg/dL (ref 0–99)
Triglycerides: 62 mg/dL (ref 0–149)
VLDL Cholesterol Cal: 12 mg/dL (ref 5–40)

## 2018-11-16 LAB — HEMOGLOBIN A1C
Est. average glucose Bld gHb Est-mCnc: 108 mg/dL
Hgb A1c MFr Bld: 5.4 % (ref 4.8–5.6)

## 2018-12-14 ENCOUNTER — Telehealth: Payer: Self-pay | Admitting: *Deleted

## 2018-12-14 ENCOUNTER — Other Ambulatory Visit: Payer: Self-pay | Admitting: Emergency Medicine

## 2018-12-14 DIAGNOSIS — Z8719 Personal history of other diseases of the digestive system: Secondary | ICD-10-CM

## 2018-12-14 NOTE — Telephone Encounter (Signed)
The lab order form from [facility] is at nurse's station, if any questions.

## 2018-12-14 NOTE — Telephone Encounter (Signed)
Called patient left message in mobile voice mail to schedule an appointment for lab work only. Future orders are pending to be released.

## 2018-12-15 ENCOUNTER — Ambulatory Visit: Payer: BC Managed Care – PPO

## 2018-12-15 ENCOUNTER — Other Ambulatory Visit: Payer: Self-pay

## 2018-12-15 ENCOUNTER — Other Ambulatory Visit: Payer: Self-pay | Admitting: Emergency Medicine

## 2018-12-15 DIAGNOSIS — Z8719 Personal history of other diseases of the digestive system: Secondary | ICD-10-CM

## 2018-12-16 LAB — CBC WITH DIFFERENTIAL/PLATELET
Basophils Absolute: 0 10*3/uL (ref 0.0–0.2)
Basos: 1 %
EOS (ABSOLUTE): 0.4 10*3/uL (ref 0.0–0.4)
Eos: 5 %
Hematocrit: 43.5 % (ref 37.5–51.0)
Hemoglobin: 14.6 g/dL (ref 13.0–17.7)
Immature Grans (Abs): 0 10*3/uL (ref 0.0–0.1)
Immature Granulocytes: 0 %
Lymphocytes Absolute: 1.6 10*3/uL (ref 0.7–3.1)
Lymphs: 22 %
MCH: 29.2 pg (ref 26.6–33.0)
MCHC: 33.6 g/dL (ref 31.5–35.7)
MCV: 87 fL (ref 79–97)
Monocytes Absolute: 0.6 10*3/uL (ref 0.1–0.9)
Monocytes: 8 %
Neutrophils Absolute: 4.8 10*3/uL (ref 1.4–7.0)
Neutrophils: 64 %
Platelets: 316 10*3/uL (ref 150–450)
RBC: 5 x10E6/uL (ref 4.14–5.80)
RDW: 13.2 % (ref 11.6–15.4)
WBC: 7.4 10*3/uL (ref 3.4–10.8)

## 2018-12-16 LAB — COMPREHENSIVE METABOLIC PANEL
ALT: 35 IU/L (ref 0–44)
AST: 32 IU/L (ref 0–40)
Albumin/Globulin Ratio: 1.6 (ref 1.2–2.2)
Albumin: 4.4 g/dL (ref 4.1–5.2)
Alkaline Phosphatase: 93 IU/L (ref 39–117)
BUN/Creatinine Ratio: 13 (ref 9–20)
BUN: 14 mg/dL (ref 6–20)
Bilirubin Total: <0.2 mg/dL (ref 0.0–1.2)
CO2: 26 mmol/L (ref 20–29)
Calcium: 9.5 mg/dL (ref 8.7–10.2)
Chloride: 103 mmol/L (ref 96–106)
Creatinine, Ser: 1.05 mg/dL (ref 0.76–1.27)
GFR calc Af Amer: 115 mL/min/{1.73_m2} (ref >59)
GFR calc non Af Amer: 100 mL/min/{1.73_m2} (ref >59)
Globulin, Total: 2.7 g/dL (ref 1.5–4.5)
Glucose: 116 mg/dL — ABNORMAL HIGH (ref 65–99)
Potassium: 4.2 mmol/L (ref 3.5–5.2)
Sodium: 141 mmol/L (ref 134–144)
Total Protein: 7.1 g/dL (ref 6.0–8.5)

## 2018-12-16 LAB — VITAMIN D 25 HYDROXY (VIT D DEFICIENCY, FRACTURES): Vit D, 25-Hydroxy: 14.5 ng/mL — ABNORMAL LOW (ref 30.0–100.0)

## 2018-12-16 LAB — SEDIMENTATION RATE: Sed Rate: 14 mm/hr (ref 0–15)

## 2018-12-16 LAB — GAMMA GT: GGT: 56 IU/L (ref 0–65)

## 2018-12-16 LAB — C-REACTIVE PROTEIN: CRP: 7 mg/L (ref 0–10)

## 2018-12-18 LAB — QUANTIFERON-TB GOLD PLUS
QuantiFERON Mitogen Value: >10 IU/mL
QuantiFERON Nil Value: 0.03 IU/mL
QuantiFERON TB1 Ag Value: 0.04 IU/mL
QuantiFERON TB2 Ag Value: 0.03 IU/mL
QuantiFERON-TB Gold Plus: NEGATIVE

## 2018-12-22 ENCOUNTER — Telehealth: Payer: Self-pay | Admitting: *Deleted

## 2018-12-22 NOTE — Telephone Encounter (Signed)
Faxed lab results to Attn: Cathie Hoops, RN BSN CPN. Confirmation page 5:52.

## 2019-01-04 ENCOUNTER — Ambulatory Visit (INDEPENDENT_AMBULATORY_CARE_PROVIDER_SITE_OTHER): Payer: BC Managed Care – PPO | Admitting: Emergency Medicine

## 2019-01-04 ENCOUNTER — Other Ambulatory Visit: Payer: Self-pay

## 2019-01-04 DIAGNOSIS — Z23 Encounter for immunization: Secondary | ICD-10-CM | POA: Diagnosis not present

## 2019-01-04 NOTE — Progress Notes (Signed)
Pt came in the office today -- tdap shot. Right deltoid.

## 2019-02-13 ENCOUNTER — Encounter: Payer: Self-pay | Admitting: Pediatric Gastroenterology

## 2019-02-13 HISTORY — PX: ESOPHAGOGASTRODUODENOSCOPY: SHX1529

## 2019-02-13 HISTORY — PX: COLONOSCOPY: SHX174

## 2019-02-15 ENCOUNTER — Telehealth: Payer: Self-pay | Admitting: Gastroenterology

## 2019-02-15 NOTE — Telephone Encounter (Signed)
Hi Dr. Loletha Carrow, we have received a referral from Dr. Sebastian Ache at [facility]. Dr. Sebastian Ache would like pt to continue his GI care with you. Patient was dx with Jacob Mack few years ago. Records sent by Dr. Sebastian Ache will be sent to you for review. Please advice on scheduling. Thank you.

## 2019-02-16 NOTE — Telephone Encounter (Signed)
I would be happy to see him at my next available new patient appointment. He has a complex condition, and needs to be booked for a double slot or last patient of the day.  He had an upper endoscopy and colonoscopy with his [facility] GI doctor this week, and those reports are included with these records. However, it would be very helpful if I had color copies of the entire report or at least the photographs taken during those procedures.  If this patient was not given color photos at the time of his procedure, then please request that original color copies of the reports being mailed to [place] by his referring physician at [facility]. I also need the biopsy reports from this week when they are available.  Referring physician should be able to provide those on request.  He will need to continue his current treatment under management of his [place] until he is able to see me.

## 2019-02-17 NOTE — Telephone Encounter (Signed)
Samantha from [facility] returned my call, she is working remotely so she is not able to get hard color copies of report but she will email me the images and path results.

## 2019-02-17 NOTE — Telephone Encounter (Signed)
Left message at Dr. Barbee Cough office to call back to request records that Dr. Loletha Carrow need.

## 2019-02-22 ENCOUNTER — Encounter: Payer: Self-pay | Admitting: Gastroenterology

## 2019-02-22 NOTE — Telephone Encounter (Signed)
Consult scheduled on 03/29/19 at 9:00am. For 40 minutes.

## 2019-03-16 ENCOUNTER — Other Ambulatory Visit: Payer: Self-pay

## 2019-03-16 ENCOUNTER — Ambulatory Visit (INDEPENDENT_AMBULATORY_CARE_PROVIDER_SITE_OTHER): Payer: BC Managed Care – PPO | Admitting: Family Medicine

## 2019-03-16 ENCOUNTER — Encounter: Payer: Self-pay | Admitting: Family Medicine

## 2019-03-16 ENCOUNTER — Ambulatory Visit (INDEPENDENT_AMBULATORY_CARE_PROVIDER_SITE_OTHER): Payer: BC Managed Care – PPO

## 2019-03-16 ENCOUNTER — Ambulatory Visit: Payer: BC Managed Care – PPO | Admitting: Emergency Medicine

## 2019-03-16 VITALS — BP 117/76 | HR 69 | Temp 98.5°F | Ht 66.0 in | Wt 174.0 lb

## 2019-03-16 DIAGNOSIS — M25511 Pain in right shoulder: Secondary | ICD-10-CM

## 2019-03-16 DIAGNOSIS — S43101A Unspecified dislocation of right acromioclavicular joint, initial encounter: Secondary | ICD-10-CM | POA: Diagnosis not present

## 2019-03-16 MED ORDER — IBUPROFEN 600 MG PO TABS: 600.0000 mg | ORAL_TABLET | Freq: Three times a day (TID) | ORAL | 0 refills | Status: DC | PRN

## 2019-03-16 MED ORDER — CYCLOBENZAPRINE HCL 10 MG PO TABS: 10.0000 mg | ORAL_TABLET | Freq: Three times a day (TID) | ORAL | 0 refills | Status: DC | PRN

## 2019-03-16 NOTE — Patient Instructions (Addendum)
If you have lab work done today you will be contacted with your lab results within the next 2 weeks.  If you have not heard from [place] then please contact us. The fastest way to get your results is to register for My Chart.   IF you received an x-ray today, you will receive an invoice from [facility]. Please contact [facility] at [phone] with questions or concerns regarding your invoice.   IF you received labwork today, you will receive an invoice from [facility]. Please contact [facility] at [phone] with questions or concerns regarding your invoice.   Our billing staff will not be able to assist you with questions regarding bills from these companies.  You will be contacted with the lab results as soon as they are available. The fastest way to get your results is to activate your My Chart account. Instructions are located on the last page of this paperwork. If you have not heard from [place] regarding the results in 2 weeks, please contact this office.     Acromioclavicular Separation  A shoulder separation (acromioclavicular separation) is an injury to the tissues that connect bones to each other (ligaments) between the top of your shoulder blade (acromion) and your collarbone (clavicle). The ligaments may be stretched, partially torn, or completely torn.  A stretched ligament may not cause much pain, and it does not move the collarbone out of place. A stretched ligament looks normal on an X-ray.  A partial tear causes an injury that is a bit worse, and it may move the collarbone slightly out of place.  A complete tear causes serious injury. The surrounding shoulder ligaments are completely torn. This moves the collarbone out of position and creates a bad shape (deformity) of the shoulder. What are the causes? Common causes of this condition include:  Falling on the shoulder.  Receiving a hard, direct hit (blow) to the top of the shoulder.  Falling on an  outstretched arm. What increases the risk? You may be at greater risk of a shoulder separation if you:  Are male.  Are younger than 56.  Play a contact sport, such as football or hockey. What are the signs or symptoms? The most common symptom of a shoulder separation is pain on the top of the shoulder after falling on it or receiving a blow to it. Other signs and symptoms include:  Shoulder deformity.  Swelling of the shoulder.  Decreased ability to move the shoulder.  Bruising on top of the shoulder. How is this diagnosed? Your health care provider may suspect a shoulder separation based on your symptoms and the details of a recent injury you experienced. The condition will be diagnosed based on:  A physical exam. Your provider may: ? Press on your shoulder. ? Test the movement of your shoulder. ? Ask you to hold a weight in your hand to see if the separation increases.  Imaging tests, such as: ? X-rays. ? MRI. How is this treated? Treatment for this condition depends on the cause and severity of the injury.  A shoulder separation caused by a stretched ligament may require 2-12 weeks of the following: ? Wearing a sling. ? Taking medicines to help relieve pain. ? Applying cold packs to your shoulder. ? Physical therapy. If needed, a physical therapist will teach you to do daily exercises to strengthen your shoulder muscles and prevent stiffness.  Surgery may be needed for severe injuries that include breaks (fractures) in a bone, or injuries  that do not get better with nonsurgical treatments. To help with healing, you will need to keep your joint in place for a period of time (immobilization) and do physical therapy. Follow these instructions at home: Medicines  Take over-the-counter and prescription medicines only as told by your health care provider.  Do not drive or use heavy machinery while taking prescription pain medicine.  If you are taking prescription pain  medicine, take actions to prevent or treat constipation. Your health care provider may recommend that you: ? Drink enough fluid to keep your urine pale yellow. ? Eat foods that are high in fiber, such as fresh fruits and vegetables, whole grains, and beans. ? Limit foods that are high in fat and processed sugars, such as fried or sweet foods. ? Take an over-the-counter or prescription medicine for constipation. If you have a sling:  Wear your sling as told by your health care provider. Remove it only as told by your health care provider.  Loosen the sling if your fingers tingle, become numb, or turn cold and blue.  Keep the sling clean.  If the sling is not waterproof: ? Do not let it get wet. ? Cover it with a watertight covering when you take a bath or a shower. Managing pain, stiffness, and swelling   If directed, apply ice to the top of your shoulder: ? Put ice in a plastic bag. ? Place a towel between your skin and the bag. ? Leave the ice on for 20 minutes, 2-3 times a day.  Do not do any activities that make your pain worse. Activity  Do not lift anything that is heavier than 10 lb (4.5 kg), or the limit that you were told, until your health care provider says that it is safe.  Rest your shoulder. Avoid activities that take a lot of effort (strenuous activities) for as long as told by your health care provider.  Return to your normal activities as told by your health care provider. Ask your health care provider what activities are safe for you.  Do range of motion exercises as told by your health care provider. General instructions  Do not use any products that contain nicotine or tobacco, such as cigarettes and e-cigarettes. These can delay healing. If you need help quitting, ask your health care provider.  Keep all follow-up visits as told by your health care provider. This is important. These include visits for physical therapy as directed by your health care  provider. Contact a health care provider if:  Your pain medicine is not relieving your pain.  Your pain and stiffness are not improving after 2 weeks.  You are unable to do your physical therapy exercises because of pain or stiffness. Get help right away if:  Your arm on the injured side feels cold or numb.  Your skin or fingers on the arm on the injured side turn blue or gray. Summary  A shoulder separation (acromioclavicular separation) is an injury to the tissues that connect bones to each other (ligaments) between the top of your shoulder blade (acromion) and your collarbone (clavicle).  The ligaments may be stretched, partially torn, or completely torn.  The most common cause of a shoulder separation is falling on or receiving a blow to the top of the shoulder. Falling with an outstretched arm may also cause this injury.  Rest your shoulder. Avoid activities that take a lot of effort (strenuous activities) for as long as told by your health care provider. This  information is not intended to replace advice given to you by your health care provider. Make sure you discuss any questions you have with your health care provider. Document Revised: 03/06/2017 Document Reviewed: 03/06/2017 Elsevier Patient Education  2020 [employer].
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
## 2019-03-23 ENCOUNTER — Other Ambulatory Visit: Payer: Self-pay

## 2019-03-23 ENCOUNTER — Telehealth (INDEPENDENT_AMBULATORY_CARE_PROVIDER_SITE_OTHER): Payer: BC Managed Care – PPO | Admitting: Family Medicine

## 2019-03-23 ENCOUNTER — Other Ambulatory Visit: Payer: Self-pay | Admitting: Family Medicine

## 2019-03-23 DIAGNOSIS — M25511 Pain in right shoulder: Secondary | ICD-10-CM | POA: Diagnosis not present
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
## 2019-03-23 NOTE — Patient Instructions (Signed)
° ° ° °  If you have lab work done today you will be contacted with your lab results within the next 2 weeks.  If you have not heard from us then please contact us. The fastest way to get your results is to register for My Chart. ° ° °IF you received an x-ray today, you will receive an invoice from [facility]. Please contact  Radiology at [phone] with questions or concerns regarding your invoice.  ° °IF you received labwork today, you will receive an invoice from [facility]. Please contact [facility] at [phone] with questions or concerns regarding your invoice.  ° °Our billing staff will not be able to assist you with questions regarding bills from these companies. ° °You will be contacted with the lab results as soon as they are available. The fastest way to get your results is to activate your My Chart account. Instructions are located on the last page of this paperwork. If you have not heard from us regarding the results in 2 weeks, please contact this office. °  ° ° ° °

## 2019-03-23 NOTE — Progress Notes (Signed)
CC: Shoulder pain f/u. Per pt pain with movement and it feels like a needle poking type pain.  Pain level 6/10 with movement.  Not taking any otc med for pain.  No other concerns for provider today and no refills needed.  No travel outside the [place] or  in the past 3 weeks.

## 2019-03-29 ENCOUNTER — Ambulatory Visit: Payer: BC Managed Care – PPO | Admitting: Gastroenterology

## 2019-03-29 ENCOUNTER — Ambulatory Visit (INDEPENDENT_AMBULATORY_CARE_PROVIDER_SITE_OTHER): Payer: BC Managed Care – PPO | Admitting: Gastroenterology

## 2019-03-29 ENCOUNTER — Encounter: Payer: Self-pay | Admitting: Gastroenterology

## 2019-03-29 ENCOUNTER — Other Ambulatory Visit: Payer: Self-pay

## 2019-03-29 VITALS — BP 104/68 | HR 72 | Temp 97.9°F | Ht 66.25 in | Wt 177.5 lb

## 2019-03-29 DIAGNOSIS — K200 Eosinophilic esophagitis: Secondary | ICD-10-CM | POA: Diagnosis not present

## 2019-03-29 DIAGNOSIS — K5080 Crohn's disease of both small and large intestine without complications: Secondary | ICD-10-CM

## 2019-03-29 DIAGNOSIS — R140 Abdominal distension (gaseous): Secondary | ICD-10-CM

## 2019-03-29 NOTE — Patient Instructions (Signed)
If you are age 25 or older, your body mass index should be between 23-30. Your Body mass index is 28.43 kg/m. If this is out of the aforementioned range listed, please consider follow up with your Primary Care Provider.  If you are age 66 or younger, your body mass index should be between 19-25. Your Body mass index is 28.43 kg/m. If this is out of the aformentioned range listed, please consider follow up with your Primary Care Provider.   Follow up in 3 months. We have placed a recall.  It was a pleasure to see you today!  Dr. Loletha Carrow

## 2019-03-29 NOTE — Progress Notes (Signed)
Jacob Mack Consult Note:  History: Jacob Mack 03/29/2019  Referring provider: Horald Pollen, MD;  Jacob Barefoot, MD (pediatric Mack, [facility])  Reason for consult/chief complaint: Crohn's Disease (no compalints today, to get established)   Subjective  HPI: This is a very pleasant 24 year old man referred by his pediatric gastroenterologist, transitioning care for Crohn's disease. Summary of patient's clinical course from 01/25/2019 visit with Dr. Sebastian Mack indicates the following: Crohn's disease of the stomach, jejunum, ileum and colon diagnosed March 2013, initially treated with Pentasa, then referred to [facility].  EGD, colonoscopy and CT enterography performed.  Treatment with infliximab 5 mg/kg and methotrexate 7.5 mg weekly was started in September 2013.  Follow-up procedures in July 2017 apparently showed improvement still persistent inflammation.  CT enterography at that time had no obstruction, ileal inflammation was apparently improved from the initial diagnosis.  Methotrexate was increased to 25 mg weekly.  Upper endoscopy showed eosinophilic esophagitis, patient did not tolerate budesonide slurry and PPI, so changed to fluticasone. Crohn's disease reportedly doing well until December 2018, when patient having severe nausea, decreased appetite and fatigue, felt to be related to the methotrexate, which was stopped.  Restaging procedures in March 2019 showed moderate ileitis and mild inflammation in descending colon.  Eosinophilic esophagitis still quite active with 110 eos/hpf despite fluticasone 880 mcg twice daily and PPI.  Infliximab increased to 10 mg/kg, patient evaluated by allergist and tried elimination of dairy and gluten. P SBO symptoms August 2019, patient required ileocecectomy with about 30 cm of ileum removed in August 2019 (Dr. Sharol Mack)  Patient was then started on Stelara in October 2019, then continued 90 mg subcutaneous every 8 weeks. At  this most recent visit in December 2020, recommendations were to continue Stelara at this dose and frequency, resume omeprazole 40 mg twice daily, vitamin D 50,000 IU by mouth weekly, start Flagyl 500 mg twice daily for 3 weeks. Restaging endoscopic procedures were done with reports as below  _________________________________________  Jacob Mack reports feeling generally well.  Ever since the surgery, his bowel habits will be somewhat irregular, varying between formed and soft.  Sometimes he has bloating and gas.  Much of this seems dependent on what he eats.  He denies rectal bleeding, nausea, vomiting.  He rarely has a feeling of food feels stuck in the esophagus, and usually only if he has eaten too quickly. He still uses the swallowed fluticasone, and typically does it first thing in the morning, then has his morning medicines with a large glass of juice about 5 minutes later. 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 He has some right shoulder pain, which she was told is tendinitis.  He does not get chronic swollen joints.  He denies episodes of painful eye redness.  Intermittent eczema rash in various locations.  ROS:  Review of Systems  Constitutional: Negative for appetite change and unexpected weight change.  HENT: Negative for mouth sores and voice change.   Eyes: Negative for pain and redness.  Respiratory: Negative for cough and shortness of breath.   Cardiovascular: Negative for chest pain and palpitations.  Genitourinary: Negative for dysuria and hematuria.  Musculoskeletal: Negative for arthralgias and myalgias.  Skin: Negative for pallor and rash.  Neurological: Negative for weakness and headaches.  Hematological: Negative for adenopathy.     Past Medical History: Past Medical History:  Diagnosis Date  .  Anemia   . Asthma   . Bowel obstruction ([place])   . Crohn's colitis  ([place]) 04/2011  . Duodenal ulcer   . Gastric ulcer   . GERD (gastroesophageal reflux disease)      Past Surgical History: Past Surgical History:  Procedure Laterality Date  . COLON RESECTION    . COLONOSCOPY  02/13/2019  . ESOPHAGOGASTRODUODENOSCOPY  02/13/2019  . SMALL INTESTINE SURGERY  10/2017     Family History: Family History  Problem Relation Age of Onset  . Diabetes Mother   . Diabetes Father   . Autism Brother   . Asthma Brother   . Diabetes Maternal Grandmother   . Diabetes Paternal Grandmother   . Blindness Paternal Grandmother     Social History: Social History   Socioeconomic History  . Marital status: Single    Spouse name: Not on file  . Number of children: 0  . Years of education: Not on file  . Highest education level: Not on file  Occupational History  . Occupation: [employer]  Tobacco Use  . Smoking status: Never Smoker  . Smokeless tobacco: Never Used  Substance and Sexual Activity  . Alcohol use: Yes    Comment: special occasion  . Drug use: No  . Sexual activity: Not on file  Other Topics Concern  . Not on file  Social History Narrative  . Not on file   Social Determinants of Health   Financial Resource Strain:   . Difficulty of Paying Living Expenses: Not on file  Food Insecurity:   . Worried About Charity fundraiser in the Last Year: Not on file  . Ran Out of Food in the Last Year: Not on file  Transportation Needs:   . Lack of Transportation (Medical): Not on file  . Lack of Transportation (Non-Medical): Not on file  Physical Activity:   . Days of Exercise per Week: Not on file  . Minutes of Exercise per Session: Not on file  Stress:   . Feeling of Stress : Not on file  Social Connections:   . Frequency of Communication with Friends and Family: Not on file  . Frequency of Social Gatherings with Friends and Family: Not on file  . Attends Religious Services: Not on file  . Active Member of Clubs or Organizations:  Not on file  . Attends Archivist Meetings: Not on file  . Marital Status: Not on file    Allergies: Allergies  Allergen Reactions  . Fish Allergy Anaphylaxis  . Peanut Oil Anaphylaxis and Swelling  . Tree Extract Swelling  . Augmentin [Amoxicillin-Pot Clavulanate] Nausea And Vomiting    Outpatient Meds: Current Outpatient Medications  Medication Sig Dispense Refill  . albuterol (PROVENTIL) (2.5 MG/3ML) 0.083% nebulizer solution INHALE CONTENTS OF ONE NEBULE VIA NEBULIZER EVERY 6 HOURS AS NEEDED FOR WHEEZE OR SHORTNESS OF BREATH 180 mL 0  . cetirizine (ZYRTEC) 10 MG tablet Take 10 mg by mouth daily.    . Cholecalciferol 1.25 MG (50000 UT) TABS Take by mouth.    . cyclobenzaprine (FLEXERIL) 10 MG tablet Take 1 tablet (10 mg total) by mouth 3 (three) times daily as needed for muscle spasms. 30 tablet 0  . fluticasone (FLOVENT HFA) 220 MCG/ACT inhaler Take by mouth.    Marland Kitchen ibuprofen (ADVIL) 600 MG tablet TAKE 1 TABLET(600 MG) BY MOUTH EVERY 8 HOURS AS NEEDED 30 tablet 1  . montelukast (SINGULAIR) 10 MG tablet Take 1 tablet (10 mg total) by mouth at  bedtime. 90 tablet 0  . naproxen (NAPROSYN) 500 MG tablet Take 1 tablet (500 mg total) by mouth 2 (two) times daily with a meal. 30 tablet 0  . omeprazole (PRILOSEC) 40 MG capsule Take 40 mg by mouth daily.    Marland Kitchen triamcinolone cream (KENALOG) 0.1 % Apply 1 application topically 2 (two) times daily. 30 g 0  . ustekinumab (STELARA) 90 MG/ML SOSY injection Inject into the skin.     No current facility-administered medications for this visit.      ___________________________________________________________________ Objective   Exam:  BP 104/68 (BP Location: Left Arm, Patient Position: Sitting, Cuff Size: Normal)   Pulse 72   Temp 97.9 F (36.6 C)   Ht 5' 6.25" (1.683 m) Comment: height measured without shoes  Wt 177 lb 8 oz (80.5 kg)   BMI 28.43 kg/m    General: Well-appearing, pleasant and conversational  Eyes: sclera  anicteric, no redness  ENT: oral mucosa moist without lesions, no cervical or supraclavicular lymphadenopathy  CV: RRR without murmur, S1/S2, no JVD, no peripheral edema  Resp: clear to auscultation bilaterally, normal RR and effort noted  GI: soft, no tenderness, with active bowel sounds. No guarding or palpable organomegaly noted.  Skin; warm and dry, no rash or jaundice noted  Neuro: awake, alert and oriented x 3. Normal gross motor function and fluent speech  Labs:  On 12/09/2018, hemoglobin 14.6, WBC 7.4, platelet 316, vitamin D 14.5, GGT 56, CRP 7, ESR 14, creatinine 1.0, albumin 4.4, AST 32, ALT 35, TB quantified on gold negative  On 02/13/2019, CBC normal, creatinine 1.0, ESR 11, CRP 11.6, albumin 4.0, AST 38, ALT 36, GGT 64, alkaline phos 88, 25 OH vitamin D low at 11.6 (normal 20-80)   Endoscopic procedures:  On 02/13/2019: Colonoscopy revealed a normal-appearing neoterminal ileum, biopsies taken.  There was inflammation described as moderate with altered vascularity, edema, erythema, friability and loss of vascularity with shallow ulcerations in the anastomosis.  Biopsies were taken.  Remainder of colon described as normal.  EGD that same day showed mucosal changes characteristic of EOE with a ringed esophagus, biopsies were taken.  There was also felt to be Candida esophagitis as well. There were a few linear gastric ulcers with no stigmata of bleeding in the pylorus and a few small cratered duodenal ulcers with no stigmata of bleeding throughout the duodenum.  Stents of biopsies taken.  Distal esophageal biopsies showed 27 eos per hpf, no fungal organisms seen  Gastric biopsy with no H. pylori or viral effect.  Duodenal biopsy with mild reactive changes, no evidence of celiac sprue  Right and left colon biopsies normal.  Neoterminal ileal biopsy with intact architecture and focal minimal superficial acute inflammation, negative for dysplasia.  No viral cytopathic  effect.  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  Assessment: Encounter Diagnoses  Name Primary?  . Crohn's disease of both small and large intestine without complication ([place]) Yes  . Abdominal bloating   . Eosinophilic esophagitis     Ileocolonic Crohn's disease, minimally active at the surgical anastomosis on Stelara monotherapy every 8 weeks. Eosinophilic esophagitis, seems minimally symptomatic.  I did recommend that he take the Flovent after his breakfast and morning meds, and that he have 5 cc of water to swish and swallow afterwards to prevent oral candidiasis. Low vitamin D level, on supplement.  Not clear if his dose was changed recently with the low serum level.  Plan:  Continue his current management plan. See me in 3 months or sooner if needed.  At that point, recheck some lab work.  Total time 60 minutes, extensive record review required.  Thank you for the courtesy of this consult.  Please call me with any questions or concerns.  Nelida Meuse III  CC: Referring provider noted above

## 2019-06-05 ENCOUNTER — Other Ambulatory Visit: Payer: Self-pay

## 2019-06-05 ENCOUNTER — Encounter: Payer: Self-pay | Admitting: Emergency Medicine

## 2019-06-05 ENCOUNTER — Ambulatory Visit: Payer: BC Managed Care – PPO | Admitting: Emergency Medicine

## 2019-06-05 VITALS — BP 113/71 | HR 63 | Temp 98.0°F | Resp 16 | Ht 66.0 in | Wt 171.8 lb

## 2019-06-05 DIAGNOSIS — S30860A Insect bite (nonvenomous) of lower back and pelvis, initial encounter: Secondary | ICD-10-CM | POA: Diagnosis not present

## 2019-06-05 DIAGNOSIS — W57XXXA Bitten or stung by nonvenomous insect and other nonvenomous arthropods, initial encounter: Secondary | ICD-10-CM

## 2019-06-05 MED ORDER — DOXYCYCLINE HYCLATE 100 MG PO TABS: 100.0000 mg | ORAL_TABLET | Freq: Two times a day (BID) | ORAL | 0 refills | Status: AC

## 2019-06-05 NOTE — Progress Notes (Signed)
Jacob Mack 24 y.o.   Chief Complaint  Patient presents with  . Tick Removal    per patient removed from right side this morning    HISTORY OF PRESENT ILLNESS: This is a 24 y.o. male complaining of tick bite.  Found a tick attached to his right flank area this morning.  Removed by his girlfriend.  Picture of it shows engorged tick.  Uncertain how long tick was attached. Has history of Crohn's disease.  No other complaints or medical concerns today.  HPI   Prior to Admission medications   Medication Sig Start Date End Date Taking? Authorizing Provider  albuterol (PROVENTIL) (2.5 MG/3ML) 0.083% nebulizer solution INHALE CONTENTS OF ONE NEBULE VIA NEBULIZER EVERY 6 HOURS AS NEEDED FOR WHEEZE OR SHORTNESS OF BREATH 05/02/18  Yes Rayland Hamed, Ines Bloomer, MD  cetirizine (ZYRTEC) 10 MG tablet Take 10 mg by mouth daily.   Yes [provider]  montelukast (SINGULAIR) 10 MG tablet Take 1 tablet (10 mg total) by mouth at bedtime. 05/07/18  Yes Kenyah Luba, Ines Bloomer, MD  omeprazole (PRILOSEC) 40 MG capsule Take 40 mg by mouth daily.   Yes [provider]  ustekinumab (STELARA) 90 MG/ML SOSY injection Inject into the skin. 11/01/17  Yes [provider]  cyclobenzaprine (FLEXERIL) 10 MG tablet Take 1 tablet (10 mg total) by mouth 3 (three) times daily as needed for muscle spasms. Patient not taking: Reported on 06/05/2019 03/16/19   Rutherford Guys, MD  doxycycline (VIBRA-TABS) 100 MG tablet Take 1 tablet (100 mg total) by mouth 2 (two) times daily for 7 days. 06/05/19 06/12/19  Horald Pollen, MD  fluticasone [facility]) 220 MCG/ACT inhaler Take by mouth. 10/29/17 03/29/19  [provider]  ibuprofen (ADVIL) 600 MG tablet TAKE 1 TABLET(600 MG) BY MOUTH EVERY 8 HOURS AS NEEDED Patient not taking: Reported on 06/05/2019 03/23/19   Horald Pollen, MD  naproxen (NAPROSYN) 500 MG tablet Take 1 tablet (500 mg total) by mouth 2 (two) times daily with a meal. Patient  not taking: Reported on 06/05/2019 08/09/17   Tereasa Coop, PA-C  triamcinolone cream (KENALOG) 0.1 % Apply 1 application topically 2 (two) times daily. 04/11/18   Horald Pollen, MD    Allergies  Allergen Reactions  . Fish Allergy Anaphylaxis  . Peanut Oil Anaphylaxis and Swelling  . Tree Extract Swelling  . Augmentin [Amoxicillin-Pot Clavulanate] Nausea And Vomiting    Patient Active Problem List   Diagnosis Date Noted  . Eosinophilic esophagitis 73/53/2992  . Crohn's disease ([place]) 05/23/2012    Past Medical History:  Diagnosis Date  . Anemia   . Asthma   . Bowel obstruction ([place])   . Crohn's colitis ([place]) 04/2011  . Duodenal ulcer   . Gastric ulcer   . GERD (gastroesophageal reflux disease)     Past Surgical History:  Procedure Laterality Date  . COLON RESECTION    . COLONOSCOPY  02/13/2019  . ESOPHAGOGASTRODUODENOSCOPY  02/13/2019  . SMALL INTESTINE SURGERY  10/2017    Social History   Socioeconomic History  . Marital status: Single    Spouse name: Not on file  . Number of children: 0  . Years of education: Not on file  . Highest education level: Not on file  Occupational History  . Occupation: [employer]  Tobacco Use  . Smoking status: Never Smoker  . Smokeless tobacco: Never Used  Substance and Sexual Activity  . Alcohol use: Yes    Comment: special occasion  .  Drug use: No  . Sexual activity: Not on file  Other Topics Concern  . Not on file  Social History Narrative  . Not on file   Social Determinants of Health   Financial Resource Strain:   . Difficulty of Paying Living Expenses:   Food Insecurity:   . Worried About Charity fundraiser in the Last Year:   . Arboriculturist in the Last Year:   Transportation Needs:   . Film/video editor (Medical):   Marland Kitchen Lack of Transportation (Non-Medical):   Physical Activity:   . Days of Exercise per Week:   . Minutes of Exercise per Session:   Stress:   . Feeling of Stress :    Social Connections:   . Frequency of Communication with Friends and Family:   . Frequency of Social Gatherings with Friends and Family:   . Attends Religious Services:   . Active Member of Clubs or Organizations:   . Attends Archivist Meetings:   Marland Kitchen Marital Status:   Intimate Partner Violence:   . Fear of Current or Ex-Partner:   . Emotionally Abused:   Marland Kitchen Physically Abused:   . Sexually Abused:     Family History  Problem Relation Age of Onset  . Diabetes Mother   . Diabetes Father   . Autism Brother   . Asthma Brother   . Diabetes Maternal Grandmother   . Diabetes Paternal Grandmother   . Blindness Paternal Grandmother      Review of Systems  Constitutional: Negative.  Negative for chills and fever.  HENT: Negative.  Negative for congestion and sore throat.   Respiratory: Negative.  Negative for cough and shortness of breath.   Cardiovascular: Negative.  Negative for chest pain and palpitations.  Gastrointestinal: Negative.  Negative for abdominal pain, diarrhea, nausea and vomiting.  Neurological: Negative.  Negative for dizziness and headaches.  Endo/Heme/Allergies: Negative.   All other systems reviewed and are negative.  Today's Vitals   06/05/19 1145  BP: 113/71  Pulse: 63  Resp: 16  Temp: 98 F (36.7 C)  TempSrc: Temporal  SpO2: 96%  Weight: 171 lb 12.8 oz (77.9 kg)  Height: 5' 6"  (1.676 m)   Body mass index is 27.73 kg/m.   Physical Exam Vitals reviewed.  Constitutional:      Appearance: Normal appearance.  HENT:     Head: Normocephalic.  Eyes:     Extraocular Movements: Extraocular movements intact.     Pupils: Pupils are equal, round, and reactive to light.  Cardiovascular:     Rate and Rhythm: Normal rate and regular rhythm.  Pulmonary:     Effort: Pulmonary effort is normal.  Musculoskeletal:        General: Normal range of motion.     Cervical back: Normal range of motion.  Skin:    Comments: Right lower lateral flank: Tick  bite site slightly inflamed  Neurological:     General: No focal deficit present.     Mental Status: He is alert and oriented to person, place, and time.  Psychiatric:        Mood and Affect: Mood normal.        Behavior: Behavior normal.      ASSESSMENT & PLAN: Jonael was seen today for tick removal.  Diagnoses and all orders for this visit:  Tick bite of back, initial encounter -     doxycycline (VIBRA-TABS) 100 MG tablet; Take 1 tablet (100 mg total) by mouth 2 (  two) times daily for 7 days.    Patient Instructions       If you have lab work done today you will be contacted with your lab results within the next 2 weeks.  If you have not heard from [place] then please contact us. The fastest way to get your results is to register for My Chart.   IF you received an x-ray today, you will receive an invoice from [facility]. Please contact [facility] at [phone] with questions or concerns regarding your invoice.   IF you received labwork today, you will receive an invoice from [facility]. Please contact [facility] at [phone] with questions or concerns regarding your invoice.   Our billing staff will not be able to assist you with questions regarding bills from these companies.  You will be contacted with the lab results as soon as they are available. The fastest way to get your results is to activate your My Chart account. Instructions are located on the last page of this paperwork. If you have not heard from [place] regarding the results in 2 weeks, please contact this office.     Tick Bite Information, Adult  Ticks are insects that can bite. Most ticks live in shrubs and grassy areas. They climb onto people and animals that go by. Then they bite. Some ticks carry germs that can make you sick. How can I prevent tick bites?  Use an insect repellent that has 20% or higher of the ingredients DEET, picaridin, or IR3535. Put this insect repellent on: ? Bare skin.  ? The tops of your boots. ? Your pant legs. ? The ends of your sleeves.  If you use an insect repellent that has the ingredient permethrin, make sure to follow the instructions on the bottle. Treat the following: ? Clothing. ? Supplies. ? Boots. ? Tents.  Wear long sleeves, long pants, and light colors.  Tuck your pant legs into your socks.  Stay in the middle of the trail.  Try not to walk through long grass.  Before going inside your house, check your clothes, hair, and skin for ticks. Make sure to check your head, neck, armpits, waist, groin, and joint areas.  Check for ticks every day.  When you come indoors: ? Wash your clothes right away. ? Shower right away. ? Dry your clothes in a dryer on high heat for 60 minutes or more. What is the right way to remove a tick? Remove a tick from your skin as soon as possible.  To remove a tick that is crawling on your skin: ? Go outdoors and brush the tick off. ? Use tape or a lint roller.  To remove a tick that is biting: ? Wash your hands. ? If you have latex gloves, put them on. ? Use tweezers, curved forceps, or a tick-removal tool to grasp the tick. Grasp the tick as close to your skin and as close to the tick's head as possible. ? Gently pull up until the tick lets go.  Try to keep the tick's head attached to its body.  Do not twist or jerk the tick.  Do not squeeze or crush the tick. Do not try to remove a tick with heat, alcohol, petroleum jelly, or fingernail polish. How should I get rid of a tick? Here are some ways to get rid of a tick that is alive:  Place the tick in rubbing alcohol.  Place the tick in a bag or container you can close tightly.  Wrap the tick tightly in tape.  Flush the tick down the toilet. Contact a doctor if:  You have symptoms of a disease, such as: ? Pain in a muscle, joint, or bone. ? Trouble walking or moving your legs. ? Numbness in your legs. ? Inability to move (paralysis).  ? A red rash that makes a circle (bull's-eye rash). ? Redness and swelling where the tick bit you. ? A fever. ? Throwing up (vomiting) over and over. ? Diarrhea. ? Weight loss. ? Tender and swollen lymph glands. ? Shortness of breath. ? Cough. ? Belly pain (abdominal pain). ? Headache. ? Being more tired than normal. ? A change in how alert (conscious) you are. ? Confusion. Get help right away if:  You cannot remove a tick.  A part of a tick breaks off and gets stuck in your skin.  You are feeling worse. Summary  Ticks may carry germs that can make you sick.  To prevent tick bites, wear long sleeves, long pants, and light colors. Use insect repellent. Follow the instructions on the bottle.  If the tick is biting, do not try to remove it with heat, alcohol, petroleum jelly, or fingernail polish.  Use tweezers, curved forceps, or a tick-removal tool to grasp the tick. Gently pull up until the tick lets go. Do not twist or jerk the tick. Do not squeeze or crush the tick.  If you have symptoms, contact a doctor. This information is not intended to replace advice given to you by your health care provider. Make sure you discuss any questions you have with your health care provider. Document Revised: 01/03/2018 Document Reviewed: 05/01/2016 Elsevier Patient Education  2020 Elsevier Inc.      Agustina Caroli, MD [facility]

## 2019-06-05 NOTE — Patient Instructions (Addendum)
If you have lab work done today you will be contacted with your lab results within the next 2 weeks.  If you have not heard from [place] then please contact us. The fastest way to get your results is to register for My Chart.   IF you received an x-ray today, you will receive an invoice from [facility]. Please contact [facility] Rad[facility] at [phone] with questions or concerns regarding your invoice.   IF you received labwork today, you will receive an invoice from [place]. Please contact [facility] at [phone] with questions or concerns regarding your invoice.   Our billing staff will not be able to assist you with questions regarding bills from these companies.  You will be contacted with the lab results as soon as they are available. The fastest way to get your results is to activate your My Chart account. Instructions are located on the last page of this paperwork. If you have not heard from [place] regarding the results in 2 weeks, please contact this office.     Tick Bite Information, Adult  Ticks are insects that can bite. Most ticks live in shrubs and grassy areas. They climb onto people and animals that go by. Then they bite. Some ticks carry germs that can make you sick. How can I prevent tick bites?  Use an insect repellent that has 20% or higher of the ingredients DEET, picaridin, or IR3535. Put this insect repellent on: ? Bare skin. ? The tops of your boots. ? Your pant legs. ? The ends of your sleeves.  If you use an insect repellent that has the ingredient permethrin, make sure to follow the instructions on the bottle. Treat the following: ? Clothing. ? Supplies. ? Boots. ? Tents.  Wear long sleeves, long pants, and light colors.  Tuck your pant legs into your socks.  Stay in the middle of the trail.  Try not to walk through long grass.  Before going inside your house, check your clothes, hair, and skin for ticks. Make sure to check your head, neck,  armpits, waist, groin, and joint areas.  Check for ticks every day.  When you come indoors: ? Wash your clothes right away. ? Shower right away. ? Dry your clothes in a dryer on high heat for 60 minutes or more. What is the right way to remove a tick? Remove a tick from your skin as soon as possible.  To remove a tick that is crawling on your skin: ? Go outdoors and brush the tick off. ? Use tape or a lint roller.  To remove a tick that is biting: ? Wash your hands. ? If you have latex gloves, put them on. ? Use tweezers, curved forceps, or a tick-removal tool to grasp the tick. Grasp the tick as close to your skin and as close to the tick's head as possible. ? Gently pull up until the tick lets go.  Try to keep the tick's head attached to its body.  Do not twist or jerk the tick.  Do not squeeze or crush the tick. Do not try to remove a tick with heat, alcohol, petroleum jelly, or fingernail polish. How should I get rid of a tick? Here are some ways to get rid of a tick that is alive:  Place the tick in rubbing alcohol.  Place the tick in a bag or container you can close tightly.  Wrap the tick tightly in tape.  Flush the tick down the toilet.  Contact a doctor if:  You have symptoms of a disease, such as: ? Pain in a muscle, joint, or bone. ? Trouble walking or moving your legs. ? Numbness in your legs. ? Inability to move (paralysis). ? A red rash that makes a circle (bull's-eye rash). ? Redness and swelling where the tick bit you. ? A fever. ? Throwing up (vomiting) over and over. ? Diarrhea. ? Weight loss. ? Tender and swollen lymph glands. ? Shortness of breath. ? Cough. ? Belly pain (abdominal pain). ? Headache. ? Being more tired than normal. ? A change in how alert (conscious) you are. ? Confusion. Get help right away if:  You cannot remove a tick.  A part of a tick breaks off and gets stuck in your skin.  You are feeling  worse. Summary  Ticks may carry germs that can make you sick.  To prevent tick bites, wear long sleeves, long pants, and light colors. Use insect repellent. Follow the instructions on the bottle.  If the tick is biting, do not try to remove it with heat, alcohol, petroleum jelly, or fingernail polish.  Use tweezers, curved forceps, or a tick-removal tool to grasp the tick. Gently pull up until the tick lets go. Do not twist or jerk the tick. Do not squeeze or crush the tick.  If you have symptoms, contact a doctor. This information is not intended to replace advice given to you by your health care provider. Make sure you discuss any questions you have with your health care provider. Document Revised: 01/03/2018 Document Reviewed: 05/01/2016 Elsevier Patient Education  [place].

## 2019-06-08 ENCOUNTER — Other Ambulatory Visit: Payer: Self-pay | Admitting: Emergency Medicine

## 2019-06-08 DIAGNOSIS — J4521 Mild intermittent asthma with (acute) exacerbation: Secondary | ICD-10-CM

## 2019-06-08 MED ORDER — MONTELUKAST SODIUM 10 MG PO TABS: 10.0000 mg | ORAL_TABLET | Freq: Every day | ORAL | 3 refills | Status: DC

## 2019-06-08 MED ORDER — ALBUTEROL SULFATE (2.5 MG/3ML) 0.083% IN NEBU: INHALATION_SOLUTION | RESPIRATORY_TRACT | 5 refills | Status: DC

## 2019-06-22 ENCOUNTER — Ambulatory Visit: Payer: BC Managed Care – PPO | Admitting: Emergency Medicine

## 2019-06-23 ENCOUNTER — Encounter: Payer: Self-pay | Admitting: Emergency Medicine

## 2019-07-07 ENCOUNTER — Other Ambulatory Visit: Payer: Self-pay

## 2019-07-07 ENCOUNTER — Other Ambulatory Visit: Payer: Self-pay | Admitting: Family Medicine

## 2019-07-07 ENCOUNTER — Ambulatory Visit (INDEPENDENT_AMBULATORY_CARE_PROVIDER_SITE_OTHER): Payer: BC Managed Care – PPO | Admitting: Family Medicine

## 2019-07-07 ENCOUNTER — Encounter: Payer: Self-pay | Admitting: Family Medicine

## 2019-07-07 ENCOUNTER — Ambulatory Visit (INDEPENDENT_AMBULATORY_CARE_PROVIDER_SITE_OTHER): Payer: BC Managed Care – PPO

## 2019-07-07 VITALS — BP 118/69 | HR 89 | Temp 98.1°F | Ht 66.0 in | Wt 171.6 lb

## 2019-07-07 DIAGNOSIS — M545 Low back pain, unspecified: Secondary | ICD-10-CM

## 2019-07-07 MED ORDER — CYCLOBENZAPRINE HCL 10 MG PO TABS: 10.0000 mg | ORAL_TABLET | Freq: Three times a day (TID) | ORAL | 0 refills | Status: DC | PRN
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
## 2019-07-07 NOTE — Patient Instructions (Addendum)
If you have lab work done today you will be contacted with your lab results within the next 2 weeks.  If you have not heard from [place] then please contact us. The fastest way to get your results is to register for My Chart.   IF you received an x-ray today, you will receive an invoice from [facility] Radiology. Please contact [facility] Radiology at [phone] with questions or concerns regarding your invoice.   IF you received labwork today, you will receive an invoice from [place]. Please contact [facility] at [phone] with questions or concerns regarding your invoice.   Our billing staff will not be able to assist you with questions regarding bills from these companies.  You will be contacted with the lab results as soon as they are available. The fastest way to get your results is to activate your My Chart account. Instructions are located on the last page of this paperwork. If you have not heard from [place] regarding the results in 2 weeks, please contact this office.     Acute Back Pain, Adult Acute back pain is sudden and usually short-lived. It is often caused by an injury to the muscles and tissues in the back. The injury may result from:  A muscle or ligament getting overstretched or torn (strained). Ligaments are tissues that connect bones to each other. Lifting something improperly can cause a back strain.  Wear and tear (degeneration) of the spinal disks. Spinal disks are circular tissue that provides cushioning between the bones of the spine (vertebrae).  Twisting motions, such as while playing sports or doing yard work.  A hit to the back.  Arthritis. You may have a physical exam, lab tests, and imaging tests to find the cause of your pain. Acute back pain usually goes away with rest and home care. Follow these instructions at home: Managing pain, stiffness, and swelling  Take over-the-counter and prescription medicines only as told by your health care  provider.  Your health care provider may recommend applying ice during the first 24-48 hours after your pain starts. To do this: ? Put ice in a plastic bag. ? Place a towel between your skin and the bag. ? Leave the ice on for 20 minutes, 2-3 times a day.  If directed, apply heat to the affected area as often as told by your health care provider. Use the heat source that your health care provider recommends, such as a moist heat pack or a heating pad. ? Place a towel between your skin and the heat source. ? Leave the heat on for 20-30 minutes. ? Remove the heat if your skin turns bright red. This is especially important if you are unable to feel pain, heat, or cold. You have a greater risk of getting burned. Activity   Do not stay in bed. Staying in bed for more than 1-2 days can delay your recovery.  Sit up and stand up straight. Avoid leaning forward when you sit, or hunching over when you stand. ? If you work at a desk, sit close to it so you do not need to lean over. Keep your chin tucked in. Keep your neck drawn back, and keep your elbows bent at a right angle. Your arms should look like the letter "L." ? Sit high and close to the steering wheel when you drive. Add lower back (lumbar) support to your car seat, if needed.  Take short walks on even surfaces as soon as you are able. Try  to increase the length of time you walk each day.  Do not sit, drive, or stand in one place for more than 30 minutes at a time. Sitting or standing for long periods of time can put stress on your back.  Do not drive or use heavy machinery while taking prescription pain medicine.  Use proper lifting techniques. When you bend and lift, use positions that put less stress on your back: ? [place] your knees. ? Keep the load close to your body. ? Avoid twisting.  Exercise regularly as told by your health care provider. Exercising helps your back heal faster and helps prevent back injuries by keeping muscles  strong and flexible.  Work with a physical therapist to make a safe exercise program, as recommended by your health care provider. Do any exercises as told by your physical therapist. Lifestyle  Maintain a healthy weight. Extra weight puts stress on your back and makes it difficult to have good posture.  Avoid activities or situations that make you feel anxious or stressed. Stress and anxiety increase muscle tension and can make back pain worse. Learn ways to manage anxiety and stress, such as through exercise. General instructions  Sleep on a firm mattress in a comfortable position. Try lying on your side with your knees slightly bent. If you lie on your back, put a pillow under your knees.  Follow your treatment plan as told by your health care provider. This may include: ? Cognitive or behavioral therapy. ? Acupuncture or massage therapy. ? Meditation or yoga. Contact a health care provider if:  You have pain that is not relieved with rest or medicine.  You have increasing pain going down into your legs or buttocks.  Your pain does not improve after 2 weeks.  You have pain at night.  You lose weight without trying.  You have a fever or chills. Get help right away if:  You develop new bowel or bladder control problems.  You have unusual weakness or numbness in your arms or legs.  You develop nausea or vomiting.  You develop abdominal pain.  You feel faint. Summary  Acute back pain is sudden and usually short-lived.  Use proper lifting techniques. When you bend and lift, use positions that put less stress on your back.  Take over-the-counter and prescription medicines and apply heat or ice as directed by your health care provider. This information is not intended to replace advice given to you by your health care provider. Make sure you discuss any questions you have with your health care provider. Document Revised: 05/10/2018 Document Reviewed: 09/02/2016 Elsevier  Patient Education  [place].

## 2019-07-25 ENCOUNTER — Telehealth: Payer: Self-pay | Admitting: Emergency Medicine

## 2019-07-25 NOTE — Telephone Encounter (Signed)
Pt scheduled for appt Maximiano Coss 07/26/2019 for his neck pain

## 2019-07-25 NOTE — Telephone Encounter (Signed)
Pt called and is wanting a nurse to call him regarding him having neck pain. Tried to make pt an appt in office for this week, however this  didn't work with pts work sch. Please advise [phone]

## 2019-07-26 ENCOUNTER — Ambulatory Visit: Payer: BC Managed Care – PPO | Admitting: Registered Nurse

## 2019-07-27 ENCOUNTER — Encounter: Payer: Self-pay | Admitting: Registered Nurse

## 2019-07-31 ENCOUNTER — Encounter: Payer: Self-pay | Admitting: Emergency Medicine

## 2019-07-31 ENCOUNTER — Other Ambulatory Visit: Payer: Self-pay

## 2019-07-31 ENCOUNTER — Ambulatory Visit: Payer: BC Managed Care – PPO | Admitting: Emergency Medicine

## 2019-07-31 VITALS — BP 117/74 | HR 74 | Temp 98.2°F | Ht 66.0 in | Wt 170.0 lb

## 2019-07-31 DIAGNOSIS — M62838 Other muscle spasm: Secondary | ICD-10-CM

## 2019-07-31 DIAGNOSIS — Z8719 Personal history of other diseases of the digestive system: Secondary | ICD-10-CM | POA: Diagnosis not present

## 2019-07-31 DIAGNOSIS — M542 Cervicalgia: Secondary | ICD-10-CM | POA: Diagnosis not present

## 2019-07-31 DIAGNOSIS — S46811A Strain of other muscles, fascia and tendons at shoulder and upper arm level, right arm, initial encounter: Secondary | ICD-10-CM

## 2019-07-31 MED ORDER — CYCLOBENZAPRINE HCL 10 MG PO TABS: 10.0000 mg | ORAL_TABLET | Freq: Every day | ORAL | 0 refills | Status: DC

## 2019-07-31 MED ORDER — TRAMADOL HCL 50 MG PO TABS: 50.0000 mg | ORAL_TABLET | Freq: Three times a day (TID) | ORAL | 0 refills | Status: AC | PRN

## 2019-07-31 NOTE — Progress Notes (Signed)
Jacob Mack 24 y.o.   Chief Complaint  Patient presents with  . neck pain right side    off and on, go down side of back and shoulders. started last tue     HISTORY OF PRESENT ILLNESS: This is a 24 y.o. male complaining of right-sided neck pain that started 5 days ago.  Sharp constant pain without radiation.  Denies injury.  Worse with movement.  Better with rest.  Tried ice and heat and took 1 Aleve.  However patient has history of Crohn's disease so NSAIDs not recommended.  No other associated symptoms. No other complaints or medical concerns today.  HPI   Prior to Admission medications   Medication Sig Start Date End Date Taking? Authorizing Provider  albuterol (PROVENTIL) (2.5 MG/3ML) 0.083% nebulizer solution INHALE CONTENTS OF ONE NEBULE VIA NEBULIZER EVERY 6 HOURS AS NEEDED FOR WHEEZE OR SHORTNESS OF BREATH 06/08/19  Yes Sharnell Knight, Ines Bloomer, MD  cetirizine (ZYRTEC) 10 MG tablet Take 10 mg by mouth daily.   Yes [provider]  cyclobenzaprine (FLEXERIL) 10 MG tablet Take 1 tablet (10 mg total) by mouth 3 (three) times daily as needed for muscle spasms. 07/07/19  Yes Rutherford Guys, MD  fluticasone [facility]) 220 MCG/ACT inhaler Take by mouth. 10/29/17 07/31/19 Yes [provider]  montelukast (SINGULAIR) 10 MG tablet Take 1 tablet (10 mg total) by mouth at bedtime. 06/08/19  Yes Ferry Matthis, Ines Bloomer, MD  omeprazole (PRILOSEC) 40 MG capsule Take 40 mg by mouth daily.   Yes [provider]  ustekinumab (STELARA) 90 MG/ML SOSY injection Inject into the skin. 11/01/17  Yes [provider]  triamcinolone cream (KENALOG) 0.1 % Apply 1 application topically 2 (two) times daily. Patient not taking: Reported on 07/31/2019 04/11/18   Horald Pollen, MD    Allergies  Allergen Reactions  . Fish Allergy Anaphylaxis  . Peanut Oil Anaphylaxis and Swelling  . Tree Extract Swelling  . Augmentin [Amoxicillin-Pot Clavulanate] Nausea And Vomiting     Patient Active Problem List   Diagnosis Date Noted  . Eosinophilic esophagitis 29/52/8413  . Crohn's disease ([place]) 05/23/2012    Past Medical History:  Diagnosis Date  . Anemia   . Asthma   . Bowel obstruction ([place])   . Crohn's colitis ([place]) 04/2011  . Duodenal ulcer   . Gastric ulcer   . GERD (gastroesophageal reflux disease)     Past Surgical History:  Procedure Laterality Date  . COLON RESECTION    . COLONOSCOPY  02/13/2019  . ESOPHAGOGASTRODUODENOSCOPY  02/13/2019  . SMALL INTESTINE SURGERY  10/2017    Social History   Socioeconomic History  . Marital status: Single    Spouse name: Not on file  . Number of children: 0  . Years of education: Not on file  . Highest education level: Not on file  Occupational History  . Occupation: [employer]  Tobacco Use  . Smoking status: Never Smoker  . Smokeless tobacco: Never Used  Vaping Use  . Vaping Use: Never used  Substance and Sexual Activity  . Alcohol use: Yes    Comment: special occasion  . Drug use: No  . Sexual activity: Not on file  Other Topics Concern  . Not on file  Social History Narrative  . Not on file   Social Determinants of Health   Financial Resource Strain:   . Difficulty of Paying Living Expenses:   Food Insecurity:   . Worried About Charity fundraiser in  the Last Year:   . [place] in the Last Year:   Transportation Needs:   . Film/video editor (Medical):   Marland Kitchen Lack of Transportation (Non-Medical):   Physical Activity:   . Days of Exercise per Week:   . Minutes of Exercise per Session:   Stress:   . Feeling of Stress :   Social Connections:   . Frequency of Communication with Friends and Family:   . Frequency of Social Gatherings with Friends and Family:   . Attends Religious Services:   . Active Member of Clubs or Organizations:   . Attends Archivist Meetings:   Marland Kitchen Marital Status:   Intimate Partner Violence:   . Fear of Current or Ex-Partner:    . Emotionally Abused:   Marland Kitchen Physically Abused:   . Sexually Abused:     Family History  Problem Relation Age of Onset  . Diabetes Mother   . Diabetes Father   . Autism Brother   . Asthma Brother   . Diabetes Maternal Grandmother   . Diabetes Paternal Grandmother   . Blindness Paternal Grandmother      Review of Systems  Constitutional: Negative.  Negative for chills and fever.  HENT: Negative.  Negative for congestion and sore throat.   Respiratory: Negative.  Negative for cough and shortness of breath.   Cardiovascular: Negative.  Negative for chest pain and palpitations.  Gastrointestinal: Negative.  Negative for abdominal pain, diarrhea, nausea and vomiting.  Genitourinary: Negative.   Musculoskeletal: Positive for neck pain.  Skin: Negative.  Negative for rash.  Neurological: Negative.  Negative for dizziness and headaches.  All other systems reviewed and are negative.  Today's Vitals   07/31/19 1111  BP: 117/74  Pulse: 74  Temp: 98.2 F (36.8 C)  TempSrc: Temporal  SpO2: 98%  Weight: 170 lb (77.1 kg)  Height: 5' 6"  (1.676 m)   Body mass index is 27.44 kg/m.   Physical Exam Vitals reviewed.  Constitutional:      Appearance: Normal appearance.  HENT:     Head: Normocephalic.  Eyes:     Extraocular Movements: Extraocular movements intact.     Pupils: Pupils are equal, round, and reactive to light.  Cardiovascular:     Rate and Rhythm: Normal rate and regular rhythm.  Pulmonary:     Effort: Pulmonary effort is normal.  Musculoskeletal:     Cervical back: No erythema. Pain with movement and muscular tenderness present. No spinous process tenderness. Decreased range of motion.     Comments: Upper extremities: Within normal limits and full range of motion.  Neurovascularly intact.  No tenderness.  Skin:    General: Skin is warm and dry.     Capillary Refill: Capillary refill takes less than 2 seconds.  Neurological:     General: No focal deficit present.      Mental Status: He is alert and oriented to person, place, and time.  Psychiatric:        Mood and Affect: Mood normal.        Behavior: Behavior normal.      ASSESSMENT & PLAN: Jacob Mack was seen today for neck pain right side.  Diagnoses and all orders for this visit:  Musculoskeletal neck pain -     traMADol (ULTRAM) 50 MG tablet; Take 1 tablet (50 mg total) by mouth every 8 (eight) hours as needed for up to 5 days.  Strain of right trapezius muscle, initial encounter  Muscle spasm -  cyclobenzaprine (FLEXERIL) 10 MG tablet; Take 1 tablet (10 mg total) by mouth at bedtime.  History of Crohn's disease    Patient Instructions       If you have lab work done today you will be contacted with your lab results within the next 2 weeks.  If you have not heard from [place] then please contact us. The fastest way to get your results is to register for My Chart.   IF you received an x-ray today, you will receive an invoice from [facility]. Please contact [facility] at [phone] with questions or concerns regarding your invoice.   IF you received labwork today, you will receive an invoice from [place]. Please contact [facility] at [phone] with questions or concerns regarding your invoice.   Our billing staff will not be able to assist you with questions regarding bills from these companies.  You will be contacted with the lab results as soon as they are available. The fastest way to get your results is to activate your My Chart account. Instructions are located on the last page of this paperwork. If you have not heard from [place] regarding the results in 2 weeks, please contact this office.      Cervical Sprain  A cervical sprain is a stretch or tear in the tissues that connect bones (ligaments) in the neck. Most neck (cervical) sprains get better in 4-6 weeks. Follow these instructions at home: If you have a neck collar:  Wear it as told by your  doctor. Do not take off (do not remove) the collar unless your doctor says that this is safe.  Ask your doctor before adjusting your collar.  If you have long hair, keep it outside of the collar.  Ask your doctor if you may take off the collar for cleaning and bathing. If you may take off the collar: ? Follow instructions from your doctor about how to take off the collar safely. ? Clean the collar by wiping it with mild soap and water. Let it air-dry all the way. ? If your collar has removable pads:  Take the pads out every 1-2 days.  Hand wash the pads with soap and water.  Let the pads air-dry all the way before you put them back in the collar. Do not dry them in a clothes dryer. Do not dry them with a hair dryer. ? Check your skin under the collar for irritation or sores. If you see any, tell your doctor. Managing pain, stiffness, and swelling   Use a cervical traction device, if told by your doctor.  If told, put heat on the affected area. Do this before exercises (physical therapy) or as often as told by your doctor. Use the heat source that your doctor recommends, such as a moist heat pack or a heating pad. ? Place a towel between your skin and the heat source. ? Leave the heat on for 20-30 minutes. ? Take the heat off (remove the heat) if your skin turns bright red. This is very important if you cannot feel pain, heat, or cold. You may have a greater risk of getting burned.  Put ice on the affected area. ? Put ice in a plastic bag. ? Place a towel between your skin and the bag. ? Leave the ice on for 20 minutes, 2-3 times a day. Activity  Do not drive while wearing a neck collar. If you do not have a neck collar, ask your doctor if it is safe to  drive.  Do not drive or use heavy machinery while taking prescription pain medicine or muscle relaxants, unless your doctor approves.  Do not lift anything that is heavier than 10 lb (4.5 kg) until your doctor tells you that it is  safe.  Rest as told by your doctor.  Avoid activities that make you feel worse. Ask your doctor what activities are safe for you.  Do exercises as told by your doctor or physical therapist. Preventing neck sprain  Practice good posture. Adjust your workstation to help with this, if needed.  Exercise regularly as told by your doctor or physical therapist.  Avoid activities that are risky or may cause a neck sprain (cervical sprain). General instructions  Take over-the-counter and prescription medicines only as told by your doctor.  Do not use any products that contain nicotine or tobacco. This includes cigarettes and e-cigarettes. If you need help quitting, ask your doctor.  Keep all follow-up visits as told by your doctor. This is important. Contact a doctor if:  You have pain or other symptoms that get worse.  You have symptoms that do not get better after 2 weeks.  You have pain that does not get better with medicine.  You start to have new, unexplained symptoms.  You have sores or irritated skin from wearing your neck collar. Get help right away if:  You have very bad pain.  You have any of the following in any part of your body: ? Loss of feeling (numbness). ? Tingling. ? Weakness.  You cannot move a part of your body (you have paralysis).  Your activity level does not improve. Summary  A cervical sprain is a stretch or tear in the tissues that connect bones (ligaments) in the neck.  If you have a neck (cervical) collar, do not take off the collar unless your doctor says that this is safe.  Put ice on affected areas as told by your doctor.  Put heat on affected areas as told by your doctor.  Good posture and regular exercise can help prevent a neck sprain from happening again. This information is not intended to replace advice given to you by your health care provider. Make sure you discuss any questions you have with your health care provider. Document  Revised: 05/11/2018 Document Reviewed: 10/01/2015 Elsevier Patient Education  2020 Elsevier Inc.      Agustina Caroli, MD [facility]

## 2019-07-31 NOTE — Patient Instructions (Addendum)
If you have lab work done today you will be contacted with your lab results within the next 2 weeks.  If you have not heard from [place] then please contact us. The fastest way to get your results is to register for My Chart.   IF you received an x-ray today, you will receive an invoice from [facility]. Please contact [facility] at [phone] with questions or concerns regarding your invoice.   IF you received labwork today, you will receive an invoice from [place]. Please contact [facility] at [phone] with questions or concerns regarding your invoice.   Our billing staff will not be able to assist you with questions regarding bills from these companies.  You will be contacted with the lab results as soon as they are available. The fastest way to get your results is to activate your My Chart account. Instructions are located on the last page of this paperwork. If you have not heard from [place] regarding the results in 2 weeks, please contact this office.      Cervical Sprain  A cervical sprain is a stretch or tear in the tissues that connect bones (ligaments) in the neck. Most neck (cervical) sprains get better in 4-6 weeks. Follow these instructions at home: If you have a neck collar:  Wear it as told by your doctor. Do not take off (do not remove) the collar unless your doctor says that this is safe.  Ask your doctor before adjusting your collar.  If you have long hair, keep it outside of the collar.  Ask your doctor if you may take off the collar for cleaning and bathing. If you may take off the collar: ? Follow instructions from your doctor about how to take off the collar safely. ? Clean the collar by wiping it with mild soap and water. Let it air-dry all the way. ? If your collar has removable pads:  Take the pads out every 1-2 days.  Hand wash the pads with soap and water.  Let the pads air-dry all the way before you put them back in the collar. Do  not dry them in a clothes dryer. Do not dry them with a hair dryer. ? Check your skin under the collar for irritation or sores. If you see any, tell your doctor. Managing pain, stiffness, and swelling   Use a cervical traction device, if told by your doctor.  If told, put heat on the affected area. Do this before exercises (physical therapy) or as often as told by your doctor. Use the heat source that your doctor recommends, such as a moist heat pack or a heating pad. ? Place a towel between your skin and the heat source. ? Leave the heat on for 20-30 minutes. ? Take the heat off (remove the heat) if your skin turns bright red. This is very important if you cannot feel pain, heat, or cold. You may have a greater risk of getting burned.  Put ice on the affected area. ? Put ice in a plastic bag. ? Place a towel between your skin and the bag. ? Leave the ice on for 20 minutes, 2-3 times a day. Activity  Do not drive while wearing a neck collar. If you do not have a neck collar, ask your doctor if it is safe to drive.  Do not drive or use heavy machinery while taking prescription pain medicine or muscle relaxants, unless your doctor approves.  Do not lift anything that is  heavier than 10 lb (4.5 kg) until your doctor tells you that it is safe.  Rest as told by your doctor.  Avoid activities that make you feel worse. Ask your doctor what activities are safe for you.  Do exercises as told by your doctor or physical therapist. Preventing neck sprain  Practice good posture. Adjust your workstation to help with this, if needed.  Exercise regularly as told by your doctor or physical therapist.  Avoid activities that are risky or may cause a neck sprain (cervical sprain). General instructions  Take over-the-counter and prescription medicines only as told by your doctor.  Do not use any products that contain nicotine or tobacco. This includes cigarettes and e-cigarettes. If you need help  quitting, ask your doctor.  Keep all follow-up visits as told by your doctor. This is important. Contact a doctor if:  You have pain or other symptoms that get worse.  You have symptoms that do not get better after 2 weeks.  You have pain that does not get better with medicine.  You start to have new, unexplained symptoms.  You have sores or irritated skin from wearing your neck collar. Get help right away if:  You have very bad pain.  You have any of the following in any part of your body: ? Loss of feeling (numbness). ? Tingling. ? Weakness.  You cannot move a part of your body (you have paralysis).  Your activity level does not improve. Summary  A cervical sprain is a stretch or tear in the tissues that connect bones (ligaments) in the neck.  If you have a neck (cervical) collar, do not take off the collar unless your doctor says that this is safe.  Put ice on affected areas as told by your doctor.  Put heat on affected areas as told by your doctor.  Good posture and regular exercise can help prevent a neck sprain from happening again. This information is not intended to replace advice given to you by your health care provider. Make sure you discuss any questions you have with your health care provider. Document Revised: 05/11/2018 Document Reviewed: 10/01/2015 Elsevier Patient Education  [place].

## 2020-01-10 ENCOUNTER — Encounter: Payer: Self-pay | Admitting: Nurse Practitioner

## 2020-01-10 ENCOUNTER — Other Ambulatory Visit (INDEPENDENT_AMBULATORY_CARE_PROVIDER_SITE_OTHER): Payer: BC Managed Care – PPO

## 2020-01-10 ENCOUNTER — Ambulatory Visit (INDEPENDENT_AMBULATORY_CARE_PROVIDER_SITE_OTHER): Payer: BC Managed Care – PPO | Admitting: Nurse Practitioner

## 2020-01-10 VITALS — BP 100/70 | HR 65 | Ht 67.0 in | Wt 158.0 lb

## 2020-01-10 DIAGNOSIS — K200 Eosinophilic esophagitis: Secondary | ICD-10-CM

## 2020-01-10 DIAGNOSIS — K5080 Crohn's disease of both small and large intestine without complications: Secondary | ICD-10-CM

## 2020-01-10 LAB — CBC WITH DIFFERENTIAL/PLATELET
Basophils Absolute: 0 10*3/uL (ref 0.0–0.1)
Basophils Relative: 0.5 % (ref 0.0–3.0)
Eosinophils Absolute: 0.3 10*3/uL (ref 0.0–0.7)
Eosinophils Relative: 3.8 % (ref 0.0–5.0)
HCT: 42.7 % (ref 39.0–52.0)
Hemoglobin: 14.4 g/dL (ref 13.0–17.0)
Lymphocytes Relative: 19 % (ref 12.0–46.0)
Lymphs Abs: 1.4 10*3/uL (ref 0.7–4.0)
MCHC: 33.7 g/dL (ref 30.0–36.0)
MCV: 88.6 fl (ref 78.0–100.0)
Monocytes Absolute: 0.4 10*3/uL (ref 0.1–1.0)
Monocytes Relative: 6 % (ref 3.0–12.0)
Neutro Abs: 5.3 10*3/uL (ref 1.4–7.7)
Neutrophils Relative %: 70.7 % (ref 43.0–77.0)
Platelets: 311 10*3/uL (ref 150.0–400.0)
RBC: 4.82 Mil/uL (ref 4.22–5.81)
RDW: 13.5 % (ref 11.5–15.5)
WBC: 7.4 10*3/uL (ref 4.0–10.5)

## 2020-01-10 LAB — COMPREHENSIVE METABOLIC PANEL
ALT: 12 U/L (ref 0–53)
AST: 16 U/L (ref 0–37)
Albumin: 4.3 g/dL (ref 3.5–5.2)
Alkaline Phosphatase: 76 U/L (ref 39–117)
BUN: 10 mg/dL (ref 6–23)
CO2: 30 mEq/L (ref 19–32)
Calcium: 9.4 mg/dL (ref 8.4–10.5)
Chloride: 101 mEq/L (ref 96–112)
Creatinine, Ser: 1.24 mg/dL (ref 0.40–1.50)
GFR: 81.07 mL/min (ref >60.00)
Glucose, Bld: 95 mg/dL (ref 70–99)
Potassium: 3.7 mEq/L (ref 3.5–5.1)
Sodium: 137 mEq/L (ref 135–145)
Total Bilirubin: 0.4 mg/dL (ref 0.2–1.2)
Total Protein: 7.5 g/dL (ref 6.0–8.3)

## 2020-01-10 LAB — C-REACTIVE PROTEIN: CRP: <1 mg/dL (ref 0.5–20.0)

## 2020-01-10 LAB — VITAMIN D 25 HYDROXY (VIT D DEFICIENCY, FRACTURES): VITD: 14 ng/mL — ABNORMAL LOW (ref 30.00–100.00)

## 2020-01-10 MED ORDER — OMEPRAZOLE 40 MG PO CPDR: 40.0000 mg | DELAYED_RELEASE_CAPSULE | Freq: Two times a day (BID) | ORAL | 1 refills | Status: DC

## 2020-01-10 NOTE — Patient Instructions (Addendum)
If you are age 25 or older, your body mass index should be between 23-30. Your Body mass index is 24.75 kg/m. If this is out of the aforementioned range listed, please consider follow up with your Primary Care Provider.  If you are age 34 or younger, your body mass index should be between 19-25. Your Body mass index is 24.75 kg/m. If this is out of the aformentioned range listed, please consider follow up with your Primary Care Provider.   Your provider has requested that you go to the basement level for lab work before leaving today. Press "B" on the elevator. The lab is located at the first door on the left as you exit the elevator.  We have sent the following medications to your pharmacy for you to pick up at your convenience: Omeprazole 40 mg twice daily.  Call and ask for Beth or send mychart message with last date of Stelara. We will need to get labs before your next injection.   Follow up with Dr. Loletha Carrow in 6 weeks.

## 2020-01-10 NOTE — Progress Notes (Addendum)
01/10/2020 Jacob Mack [id number] 1996-01-29   Chief Complaint: Crohn's disease follow up  History of Present Illness: Jacob Mack is a 24 year old male with a past medical history of eosinophilic esophagitis and Crohn's disease to the stomach, jejunum, ileum and colon initially diagnosed March 2013.  He was previously followed by pediatric gastroenterologist Dr. Sebastian Ache.  He was initially prescribed Pentasa then Infliximab 40m/kg with MTX 10/2011. Infliximab was increased to 161mkg 04/2017. He developed a partial SBO s/p ileocecectomy 09/2017. He was started on Stelara 11/2017 and maintained on Stelara 9056mQ every 8 weeks since then.  He established care in our office with Dr. DanLoletha Carrow24/2021, refer to his consultation note for a comprehensive review of the patient's Crohn's disease and past procedures.  At the time of his office visit with Dr.Danis, his Crohn's disease and EOE were stable.  He was instructed to continue Stelara 90 mg subcu every 8 weeks and Omeprazole 40 mg p.o. twice daily and to follow-up in our office in 3 months.    He presents to our office today for further Crohn's follow-up.  He questions if he needs more Stelara at this time.  He reports passing 3-5 nonbloody watery to mud-like diarrhea bowel movements daily for the past 3 to 4 months.  He previously passed a loose stool 3 times daily.  His stools remain loose since his ileocecectomy in 09/2017.  He denies having any increased urgency or tenesmus. No abdominal pain.  No blood or mucus per the rectum.  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  He lost 12 pounds over the past 4 to 5 months which he attributes to his high stress level.  He eats less when he is stressed.  No fever, sweats or chills.  No recent infections or acute illnesses.  He denies skipping any doses of Stelara, however, he cannot  recall the date of his last injection ? October or early November.  He possibly received the flu shot at school, he is not sure.  He received 2 Covid vaccinations 04/2019.  He denies having any dysphagia or heartburn.  He was previously taking Omeprazole 40 mg p.o. twice daily for eosinophilic esophagitis but he ran out of Omeprazole in September or October so he stopped taking it.  His most recent EGD and colonoscopy were [facility], see results below.  He denies NSAID use. Infrequent alcohol use.  No drug use.  He is a non-smoker.   Colon[facility]2019 [facility] neot[facility], biopsies taken.  There was inflammation described as moderate with altered vascularity, edema, erythema, friability and loss of vascularity with shallow ulcerations in the anastomosis.  Biopsies were taken. Remainder of colon described as normal.  EGD 02/13/2019: showed mucosal changes characteristic of EOE with a ringed esophagus, biopsies were taken.  There was also felt to be Candida esophagitis as well. There were a few linear gastric ulcers with no stigmata of bleeding in the pylorus and a few small cratered duodenal ulcers with no stigmata of bleeding throughout the duodenum.  Stents of biopsies taken.  Distal esophageal biopsies showed 27 eos per hpf, no fungal organisms seen  Gastric biopsy with no H. pylori or viral effect.  Duodenal biopsy with mild reactive changes, no evidence of celiac sprue  Right  and left colon biopsies normal.  Neoterminal ileal biopsy with intact architecture and focal minimal superficial acute inflammation, negative for dysplasia.  No viral cytopathic effect.  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  Current Outpatient Medications on File Prior to Visit  Medication Sig Dispense Refill  . albuterol  (PROVENTIL) (2.5 MG/3ML) 0.083% nebulizer solution INHALE CONTENTS OF ONE NEBULE VIA NEBULIZER EVERY 6 HOURS AS NEEDED FOR WHEEZE OR SHORTNESS OF BREATH 180 mL 5  . cetirizine (ZYRTEC) 10 MG tablet Take 10 mg by mouth daily.    . cyclobenzaprine (FLEXERIL) 10 MG tablet Take 1 tablet (10 mg total) by mouth at bedtime. 30 tablet 0  . montelukast (SINGULAIR) 10 MG tablet Take 1 tablet (10 mg total) by mouth at bedtime. 90 tablet 3  . ustekinumab (STELARA) 90 MG/ML SOSY injection Inject into the skin.    . fluticasone (FLOVENT HFA) 220 MCG/ACT inhaler Take by mouth.     No current facility-administered medications on file prior to visit.    Allergies  Allergen Reactions  . Fish Allergy Anaphylaxis  . Peanut Oil Anaphylaxis and Swelling  . Tree Extract Swelling  . Augmentin [Amoxicillin-Pot Clavulanate] Nausea And Vomiting    Current Medications, Allergies, Past Medical History, Past Surgical History, Family History and Social History were reviewed in [employer] record.   Review of Systems:   Constitutional: Negative for fever, sweats, chills or weight loss.  Respiratory: Negative for shortness of breath.   Cardiovascular: Negative for chest pain, palpitations and leg swelling.  Gastrointestinal: See HPI.  Musculoskeletal: Negative for back pain or muscle aches.  Neurological: Negative for dizziness, headaches or paresthesias.    Physical Exam: There were no vitals taken for this visit.  Wt Readings from Last 3 Encounters:  01/10/20 158 lb (71.7 kg)  07/31/19 170 lb (77.1 kg)  07/07/19 171 lb 9.6 oz (77.8 kg)   General: Well developed 24 year old male in no acute distress. Head: Normocephalic and atraumatic. Eyes: No scleral icterus. Conjunctiva pink . Ears: Normal auditory acuity. Mouth: Dentition intact. No ulcers or lesions.  Lungs: Clear throughout to auscultation. Heart: Regular rate and rhythm, no murmur. Abdomen: Soft, nontender and nondistended.  No masses or hepatomegaly. Normal bowel sounds x 4 quadrants.  Rectal: Deferred  Musculoskeletal: Symmetrical with no gross deformities. Extremities: No edema. Neurological: Alert oriented x 4. No focal deficits.  Psychological: Alert and cooperative. Normal mood and affect  Assessment and Recommendations:  52. 24 year old male with Crohn's disease to the stomach, small bowel and colon. PSBO s/p ileocecectomy with about 30 cm of ileum removed in August 2019. He remains on Stelera 48m SQ every 8 weeks. Patient cannot recall date of last Stelara injection, he will check his emails to verify date.  -CBC, CMP, CRP, Vitamin D -Check Ustekinumab drug and antibody level 1 to 2 days prior to next injection. Patient instructed to call me with date of last Stelara injection  -Patient to call our office if his bowel frequency increases or if he develops abdominal pain or further weight loss -Patient to follow-up in the office with Dr. DLoletha Carrowin 6 weeks, recheck weight at that time, consider CTAP if weight loss persists.   2. Eosinophilic esophagitis  -Restart Omeprazole 40 mg p.o. twice daily   ADDENDUM: Patient sent mychart msg to verify his next dose of Stelara is due on Tuesday 01/16/2020. He will go to our  lab Monday 01/15/2020 for Ustekinumab drug/antibody level and Quantiferon gold level.

## 2020-01-11 ENCOUNTER — Other Ambulatory Visit: Payer: Self-pay

## 2020-01-11 DIAGNOSIS — Z79899 Other long term (current) drug therapy: Secondary | ICD-10-CM

## 2020-01-11 DIAGNOSIS — K5080 Crohn's disease of both small and large intestine without complications: Secondary | ICD-10-CM

## 2020-01-11 MED ORDER — USTEKINUMAB 90 MG/ML ~~LOC~~ SOSY
90.0000 mg | PREFILLED_SYRINGE | SUBCUTANEOUS | 0 refills | Status: DC
Start: 2020-01-11 — End: 2020-01-15

## 2020-01-11 MED ORDER — VITAMIN D (ERGOCALCIFEROL) 1.25 MG (50000 UNIT) PO CAPS: ORAL_CAPSULE | ORAL | 0 refills | Status: DC

## 2020-01-11 NOTE — Progress Notes (Signed)
____________________________________________________________  Attending physician addendum:  Thank you for sending this case to me. I have reviewed the entire note and agree with the plan.  Excellent job on this complex patient who clearly needs closer follow up. I agree with the labs as order, including the Stelara drug and Ab levels just before his injection next week.  Must also consider C diff, SIBO and bile acid diarrhea.  Please arrange C diff toxin and antigen tests as well as fecal calprotectin when he comes to the lab soon.  I will make a sooner follow up appointment for him, decide about changing Stelara regimen based on lab results and consider colonsocopy.  Wilfrid Lund, MD  ____________________________________________________________

## 2020-01-12 ENCOUNTER — Other Ambulatory Visit: Payer: Self-pay

## 2020-01-12 DIAGNOSIS — K5080 Crohn's disease of both small and large intestine without complications: Secondary | ICD-10-CM

## 2020-01-15 ENCOUNTER — Other Ambulatory Visit: Payer: BC Managed Care – PPO

## 2020-01-15 ENCOUNTER — Telehealth: Payer: Self-pay | Admitting: Gastroenterology

## 2020-01-15 ENCOUNTER — Other Ambulatory Visit: Payer: Self-pay

## 2020-01-15 DIAGNOSIS — K5080 Crohn's disease of both small and large intestine without complications: Secondary | ICD-10-CM

## 2020-01-15 DIAGNOSIS — Z79899 Other long term (current) drug therapy: Secondary | ICD-10-CM

## 2020-01-15 NOTE — Addendum Note (Signed)
Addended by: Boris Lown B on: 01/15/2020 12:31 PM   Modules accepted: Orders

## 2020-01-15 NOTE — Telephone Encounter (Signed)
I understand he is not feeling well, concerned and upset about that.  However, it appears we have only just learned with the last several days that he has been off Stelara for months and there was some insurance problem with it.  So our nursing staff will attend to that as quickly as possible, but that process take some time.  As upset as it sounds like he is, coming here to the office to express his displeasure and concern about that will not be able to speed up the insurance approval process.  The Stelara drug level will clearly be zero, but the antibody test would still be helpful to know if that medicine can still be used for him.  He needs to get to the lab ASAP for the C. difficile testing.  Assuming it comes back negative, we can then put him on a course of prednisone to settle down his symptoms while we are sorting out his chronic treatment.  Let me know the stool test results.

## 2020-01-15 NOTE — Telephone Encounter (Signed)
Beth, pls verify if the patient went to the lab today for the Stelara drug and antibody level, fecal calprotectin and C. Diff PCR toxin A & B and Quantiferon gold level as numerous orders under lab section "active" and pending".   The above lab results are needed asap to facilitate his treatment plan in setting or active colitis/proctitis sx.   Thank you for sorting through all of this.

## 2020-01-15 NOTE — Telephone Encounter (Signed)
Pt states that he needs Stelara sent in to his pharmacy patient really upset because he states that he's getting worse because he's out of meds and feels that we're not handling it. "Threatened to come up her and handle it"

## 2020-01-15 NOTE — Telephone Encounter (Signed)
Dr. Loletha Carrow, pls review phone note. Numerous communications via [place], phone calls since his office visit 12/8. Looks like he's been off Stelara longer than he initially thought if his last Stelera injection was on 09/03/2019. Beth has communicated with the patient multiple times today to make sure he had the Stelara drug and ab test done and the C. Diff and Fecal calprotectin order you requested. His symptoms are getting worse. What are your thoughts about a brief Prednisone taper? I would appreciate your recommendations.

## 2020-01-15 NOTE — Telephone Encounter (Signed)
Spoke with the patient. He states [facility] been trying to get in touch with [place] and we are not responding. [facility]. [facility] and they have transferred the prescription there. Called [facility]. The Stelara needs a new prior authorization. The last one expired 11/09/2019. Patient last filled Stelara on in August for 1 syringe.  Called the insurance company and initiated an Urgent PA on Jacob Mack. Faxed supporting documents to [phone] PA #[id number]

## 2020-01-16 ENCOUNTER — Other Ambulatory Visit: Payer: BC Managed Care – PPO

## 2020-01-16 ENCOUNTER — Other Ambulatory Visit: Payer: Self-pay

## 2020-01-16 DIAGNOSIS — K5080 Crohn's disease of both small and large intestine without complications: Secondary | ICD-10-CM

## 2020-01-16 MED ORDER — USTEKINUMAB 90 MG/ML ~~LOC~~ SOSY: 90.0000 mg | PREFILLED_SYRINGE | SUBCUTANEOUS | 0 refills | Status: DC

## 2020-01-16 NOTE — Telephone Encounter (Signed)
See other mychart msg I sent patient yesterday afternoon which included similar instructions.

## 2020-01-16 NOTE — Telephone Encounter (Signed)
Delsa Grana has been approved until 01/15/2021.

## 2020-01-17 LAB — C. DIFFICILE GDH AND TOXIN A/B
GDH ANTIGEN: DETECTED
MICRO NUMBER:: 11315496
SPECIMEN QUALITY:: ADEQUATE
TOXIN A AND B: NOT DETECTED

## 2020-01-17 LAB — QUANTIFERON-TB GOLD PLUS
Mitogen-NIL: >10 IU/mL
NIL: 0.04 IU/mL
QuantiFERON-TB Gold Plus: NEGATIVE
TB1-NIL: 0.01 IU/mL
TB2-NIL: 0.03 IU/mL

## 2020-01-17 LAB — CLOSTRIDIUM DIFFICILE TOXIN B, QUALITATIVE, REAL-TIME PCR: Toxigenic C. Difficile by PCR: NOT DETECTED

## 2020-01-18 ENCOUNTER — Other Ambulatory Visit: Payer: Self-pay | Admitting: Nurse Practitioner

## 2020-01-18 MED ORDER — VANCOMYCIN HCL 125 MG PO CAPS: 125.0000 mg | ORAL_CAPSULE | Freq: Four times a day (QID) | ORAL | 0 refills | Status: AC

## 2020-01-26 LAB — CALPROTECTIN, FECAL: Calprotectin, Fecal: 489 ug/g — ABNORMAL HIGH (ref 0–120)

## 2020-01-26 LAB — USTEKINUMAB AND ANTI-USTEK AB
Anti-Ustekinumab Antibody: <40 ng/mL
Ustekinumab: 0.2 ug/mL

## 2020-01-30 ENCOUNTER — Telehealth: Payer: Self-pay

## 2020-01-30 NOTE — Telephone Encounter (Signed)
Left message for patient to please call back

## 2020-01-30 NOTE — Telephone Encounter (Signed)
-----   Message from Noralyn Pick, NP sent at 01/23/2020  1:21 PM EST ----- Eustaquio Maize, can you call patient Wed 12/22 and provide Dr. Loletha Carrow with an update as I am out of the office the rest of the week. Pt was prescribed Vanco on 12/14. Pls verify if his sx have improved on Vanco. If his sx have not improved, the plan was to start him on Prednisone 19m daily until he sees Dr. DLoletha Carrowin office. Thanks.  ----- Message ----- From: DDoran Stabler MD Sent: 01/22/2020   5:32 PM EST To: CNoralyn Pick NP  Difficult to know if this is elevated from the Crohn's, the probable C. difficile infection, or both  If he is not significantly improved after a week on vancomycin, then please start him on prednisone 40 mg daily. I will then manage a taper of it when he sees me at his January 5th clinic visit.  - HD

## 2020-02-07 ENCOUNTER — Encounter: Payer: Self-pay | Admitting: Gastroenterology

## 2020-02-07 ENCOUNTER — Ambulatory Visit (INDEPENDENT_AMBULATORY_CARE_PROVIDER_SITE_OTHER): Payer: Self-pay | Admitting: Gastroenterology

## 2020-02-07 VITALS — BP 100/62 | HR 64 | Ht 66.0 in | Wt 151.0 lb

## 2020-02-07 DIAGNOSIS — E559 Vitamin D deficiency, unspecified: Secondary | ICD-10-CM

## 2020-02-07 DIAGNOSIS — K5000 Crohn's disease of small intestine without complications: Secondary | ICD-10-CM

## 2020-02-07 DIAGNOSIS — K529 Noninfective gastroenteritis and colitis, unspecified: Secondary | ICD-10-CM

## 2020-02-07 MED ORDER — PLENVU 140 G PO SOLR: 140.0000 g | ORAL | 0 refills | Status: DC

## 2020-02-07 NOTE — Progress Notes (Signed)
[facility] Progress Note  Chief Complaint: Ileal Crohn's disease and eosinophilic esophagitis  Subjective  History: Jacob Mack follows up for his Crohn's disease with recent increase in abdominal pain and diarrhea.  I initially saw him in February of last year after he transition care from his pediatric gastroenterologist.  That extensive consult note outlines his prior history.  Most recent staging procedures January 2021 with findings recorded in that note. "Ileocolonic Crohn's disease, minimally active at the surgical anastomosis on Stelara monotherapy every 8 weeks. Eosinophilic esophagitis, seems minimally symptomatic.  I did recommend that he take the Flovent after his breakfast and morning meds, and that he have 5 cc of water to swish and swallow afterwards to prevent oral candidiasis. Low vitamin D level, on supplement.  Not clear if his dose was changed recently with the low serum level"  Plans were to see him back in 3 months, he was then lost to follow-up and he was seen last month by our NP with months of worsening diarrhea.  We were ultimately able to determine the patient's last Stelara treatment had been in early August. His C. difficile results are as below, and were discordant but I felt we should treat him with vancomycin for probable toxin negative C. Difficile.  Jacob Mack is glad to report that he is feeling much better in the last couple of weeks.  He finished the vancomycin and has been taking a vitamin D supplement and omeprazole for his EOE.  Other medication stable from last visit He did not have bleeding during this recent flare symptoms, and says he just did not realize the Stelara had run out of refills and he "lost track of time" since he has been quite busy in graduate school.  He is glad to report there is a job lined up in [place] after graduating this coming spring.  He denies fever, vomiting, weight loss, dysphagia or odynophagia.  He is careful about eating,  and takes his pills and food with plenty of liquids.  ROS: Cardiovascular:  no chest pain Respiratory: no dyspnea Remainder of systems negative except as above The patient's Past Medical, Family and Social History were reviewed and are on file in the EMR.  Objective:  Med list reviewed  Current Outpatient Medications:  .  albuterol (PROVENTIL) (2.5 MG/3ML) 0.083% nebulizer solution, INHALE CONTENTS OF ONE NEBULE VIA NEBULIZER EVERY 6 HOURS AS NEEDED FOR WHEEZE OR SHORTNESS OF BREATH, Disp: 180 mL, Rfl: 5 .  cetirizine (ZYRTEC) 10 MG tablet, Take 10 mg by mouth daily., Disp: , Rfl:  .  cyclobenzaprine (FLEXERIL) 10 MG tablet, Take 1 tablet (10 mg total) by mouth at bedtime., Disp: 30 tablet, Rfl: 0 .  montelukast (SINGULAIR) 10 MG tablet, Take 1 tablet (10 mg total) by mouth at bedtime., Disp: 90 tablet, Rfl: 3 .  omeprazole (PRILOSEC) 40 MG capsule, Take 1 capsule (40 mg total) by mouth 2 (two) times daily., Disp: 60 capsule, Rfl: 1 .  PEG-KCl-NaCl-NaSulf-Na Asc-C (PLENVU) 140 g SOLR, Take 140 g by mouth as directed., Disp: 1 each, Rfl: 0 .  ustekinumab (STELARA) 90 MG/ML SOSY injection, Inject 1 mL (90 mg total) into the skin every 8 (eight) weeks., Disp: 1 mL, Rfl: 0 .  Vitamin D, Ergocalciferol, (DRISDOL) 1.25 MG (50000 UNIT) CAPS capsule, Take 1 capsule every 7 days for 12 weeks, Disp: 12 capsule, Rfl: 0 .  fluticasone (FLOVENT HFA) 220 MCG/ACT inhaler, Take by mouth., Disp: , Rfl:    Vital signs  in last 24 hrs: Vitals:   02/07/20 0822  BP: 100/62  Pulse: 64    Physical Exam  Well-appearing  HEENT: sclera anicteric, oral mucosa moist without lesions  Neck: supple, no thyromegaly, JVD or lymphadenopathy  Cardiac: RRR without murmurs, S1S2 heard, no peripheral edema  Pulm: clear to auscultation bilaterally, normal RR and effort noted  Abdomen: soft, no tenderness, with active bowel sounds. No guarding or palpable hepatosplenomegaly.  Skin; warm and dry, no jaundice or  rash  Labs:  CBC Latest Ref Rng & Units 01/10/2020 12/15/2018 11/15/2018  WBC 4.0 - 10.5 K/uL 7.4 7.4 6.7  Hemoglobin 13.0 - 17.0 g/dL 14.4 14.6 14.7  Hematocrit 39.0 - 52.0 % 42.7 43.5 43.3  Platelets 150.0 - 400.0 K/uL 311.0 316 314   CMP Latest Ref Rng & Units 01/10/2020 12/15/2018 11/15/2018  Glucose 70 - 99 mg/dL 95 116(H) 95  BUN 6 - 23 mg/dL 10 14 14   Creatinine 0.40 - 1.50 mg/dL 1.24 1.05 1.16  Sodium 135 - 145 mEq/L 137 141 138  Potassium 3.5 - 5.1 mEq/L 3.7 4.2 4.3  Chloride 96 - 112 mEq/L 101 103 107(H)  CO2 19 - 32 mEq/L 30 26 24   Calcium 8.4 - 10.5 mg/dL 9.4 9.5 9.6  Total Protein 6.0 - 8.3 g/dL 7.5 7.1 7.5  Total Bilirubin 0.2 - 1.2 mg/dL 0.4 <0.2 0.4  Alkaline Phos 39 - 117 U/L 76 93 97  AST 0 - 37 U/L 16 32 27  ALT 0 - 53 U/L 12 35 24   CRP < 1.0  Vit D low at 14  Stelara level undetected (though expected since he had been off it for months) Stelara antibody negative  Fecal calprotectin elevated at 489  C. difficile PCR negative, toxin AB-, but positive GDH antigen ___________________________________________ Radiologic studies:   ____________________________________________ Other:   _____________________________________________ Assessment & Plan  Assessment: Encounter Diagnoses  Name Primary?  . Crohn's disease of small intestine without complication ([place]) Yes  . Chronic diarrhea   . Vitamin D deficiency    Ileal Crohn's disease with prior loss of response to anti-TNF therapy and subsequent ileocecal resection in 2019.  Minimally active disease on last colonoscopy with his prior gastroenterologist 1 year ago.  Recent worsening of diarrhea that appears to been a combination of toxin negative C. difficile and gap in Stelara treatment. Difficult to know if the elevated calprotectin was related to Crohn's flare or infection, but I favor more the latter.  He was able to resume the Stelara during the second or third week of December. Fortunately, he had  not developed antibodies to the medicine.  Plan: Continue his current treatment plan with next Stelara injection at 8-week interval. Colonoscopy toward the end of February.  This should allow more time for any inflammation that could have resulted from the infection to resolve, thus allowing [place] an accurate assessment of his Crohn's activity.  I also recommended that well in advance of his moved to [place] for his new job that he identify GI practice in that area and set up an appointment to be seen soon after his arrival there.  Once he informs [place] of that practice, we would send records along in advance of his visit there.  40 minutes were spent on this encounter (including chart review, history/exam, counseling/coordination of care, and documentation)  Nelida Meuse III

## 2020-02-07 NOTE — Patient Instructions (Signed)
If you are age 26 or older, your body mass index should be between 23-30. Your Body mass index is 24.37 kg/m. If this is out of the aforementioned range listed, please consider follow up with your Primary Care Provider.  If you are age 17 or younger, your body mass index should be between 19-25. Your Body mass index is 24.37 kg/m. If this is out of the aformentioned range listed, please consider follow up with your Primary Care Provider.   You have been scheduled for a colonoscopy. Please follow written instructions given to you at your visit today.  Please pick up your prep supplies at the pharmacy within the next 1-3 days. If you use inhalers (even only as needed), please bring them with you on the day of your procedure.  It was a pleasure to see you today!  Dr. Loletha Carrow

## 2020-03-07 ENCOUNTER — Ambulatory Visit (INDEPENDENT_AMBULATORY_CARE_PROVIDER_SITE_OTHER): Payer: BC Managed Care – PPO | Admitting: Gastroenterology

## 2020-03-07 ENCOUNTER — Encounter: Payer: Self-pay | Admitting: Gastroenterology

## 2020-03-07 VITALS — BP 96/56 | HR 68 | Ht 66.0 in | Wt 154.0 lb

## 2020-03-07 DIAGNOSIS — K200 Eosinophilic esophagitis: Secondary | ICD-10-CM | POA: Diagnosis not present

## 2020-03-07 DIAGNOSIS — E559 Vitamin D deficiency, unspecified: Secondary | ICD-10-CM | POA: Diagnosis not present

## 2020-03-07 DIAGNOSIS — K529 Noninfective gastroenteritis and colitis, unspecified: Secondary | ICD-10-CM

## 2020-03-07 DIAGNOSIS — K5000 Crohn's disease of small intestine without complications: Secondary | ICD-10-CM | POA: Diagnosis not present

## 2020-03-07 NOTE — Patient Instructions (Signed)
If you are age 26 or older, your body mass index should be between 23-30. Your Body mass index is 24.86 kg/m. If this is out of the aforementioned range listed, please consider follow up with your Primary Care Provider.  If you are age 44 or younger, your body mass index should be between 19-25. Your Body mass index is 24.86 kg/m. If this is out of the aformentioned range listed, please consider follow up with your Primary Care Provider.   Take 2 vitamin D  It was a pleasure to see you today!  Dr. Loletha Carrow

## 2020-03-07 NOTE — Progress Notes (Signed)
[facility] Progress Note  Chief Complaint: Ileal Crohn's and EoE  Subjective  History:  Jacob Mack is feeling well from the standpoint of his Crohn's disease.  He denies abdominal pain, and says he is back to his usual bowel pattern of 2-3 soft stools per day.  There is no bleeding, his appetite is good and weight stable.  He rarely has dysphagia, usually feeds something too quickly.  This morning he had an episode of reflux with regurgitation and heartburn, and attributes it to eating some cereal late in the evening.  He is taking all his meds as prescribed and believes he is due for next Stelara in the next week or so since he just got an email reminder from his pharmacy to arrange shipment. Lastly, he had vitamin D deficiency when seen in December, he was prescribed 50,000 units of vitamin D weekly, but he says his pharmacy was unable to get this.  He has been taking a 1000 unit capsule once daily.  ROS: Cardiovascular:  no chest pain Respiratory: no dyspnea  The patient's Past Medical, Family and Social History were reviewed and are on file in the EMR.  Objective:  Med list reviewed  Current Outpatient Medications:  .  albuterol (PROVENTIL) (2.5 MG/3ML) 0.083% nebulizer solution, INHALE CONTENTS OF ONE NEBULE VIA NEBULIZER EVERY 6 HOURS AS NEEDED FOR WHEEZE OR SHORTNESS OF BREATH, Disp: 180 mL, Rfl: 5 .  cetirizine (ZYRTEC) 10 MG tablet, Take 10 mg by mouth daily., Disp: , Rfl:  .  cyclobenzaprine (FLEXERIL) 10 MG tablet, Take 1 tablet (10 mg total) by mouth at bedtime., Disp: 30 tablet, Rfl: 0 .  montelukast (SINGULAIR) 10 MG tablet, Take 1 tablet (10 mg total) by mouth at bedtime., Disp: 90 tablet, Rfl: 3 .  omeprazole (PRILOSEC) 40 MG capsule, Take 1 capsule (40 mg total) by mouth 2 (two) times daily., Disp: 60 capsule, Rfl: 1 .  ustekinumab (STELARA) 90 MG/ML SOSY injection, Inject 1 mL (90 mg total) into the skin every 8 (eight) weeks., Disp: 1 mL, Rfl: 0 .  VITAMIN D PO,  Take 1 capsule by mouth daily., Disp: , Rfl:  .  fluticasone (FLOVENT HFA) 220 MCG/ACT inhaler, Take by mouth., Disp: , Rfl:    Vital signs in last 24 hrs: Vitals:   03/07/20 1052  BP: (!) 96/56  Pulse: 68   Wt Readings from Last 3 Encounters:  03/07/20 154 lb (69.9 kg)  02/07/20 151 lb (68.5 kg)  01/10/20 158 lb (71.7 kg)    Physical Exam  Well-appearing  HEENT: sclera anicteric, oral mucosa moist without lesions  Neck: supple, no thyromegaly, JVD or lymphadenopathy  Cardiac: RRR without murmurs, S1S2 heard, no peripheral edema  Pulm: clear to auscultation bilaterally, normal RR and effort noted  Abdomen: soft, mild LLQ tenderness, with active bowel sounds. No guarding or palpable hepatosplenomegaly.  Skin; warm and dry, no jaundice or rash  Labs:   ___________________________________________ Radiologic studies:   ____________________________________________ Other:   _____________________________________________ Assessment & Plan  Assessment: Encounter Diagnoses  Name Primary?  . Crohn's disease of small intestine without complication ([place]) Yes  . Vitamin D deficiency   . Eosinophilic esophagitis   . Chronic diarrhea    His Crohn's is back under control now after treatment of probable C. difficile and resuming his regular every 8-week Stelara dosing after several month lapse in treatment.  Continue Stelara every 8-week dosing.  He has a colonoscopy coming up in a few weeks.  If  the control is suboptimal on that, we would then do drug and antibody level with the following injection.  Increase his vitamin D to 2 capsules daily.  We will recheck his level in a couple of months.  His EOE seems minimally symptomatic right now on his allergy treatments and acid suppression.  If he should have worsening of dysphagia, upper endoscopy would be warranted for repeat biopsies and dilation. :   Nelida Meuse III

## 2020-03-14 ENCOUNTER — Other Ambulatory Visit: Payer: Self-pay | Admitting: Nurse Practitioner

## 2020-03-19 ENCOUNTER — Encounter: Payer: Self-pay | Admitting: Gastroenterology

## 2020-03-25 ENCOUNTER — Encounter: Payer: Self-pay | Admitting: Gastroenterology

## 2020-03-25 ENCOUNTER — Other Ambulatory Visit: Payer: BC Managed Care – PPO

## 2020-03-25 ENCOUNTER — Other Ambulatory Visit: Payer: Self-pay

## 2020-03-25 ENCOUNTER — Ambulatory Visit (AMBULATORY_SURGERY_CENTER): Payer: Self-pay | Admitting: Gastroenterology

## 2020-03-25 ENCOUNTER — Telehealth: Payer: Self-pay

## 2020-03-25 VITALS — BP 100/50 | HR 56 | Temp 97.3°F | Resp 12 | Ht 66.0 in | Wt 154.0 lb

## 2020-03-25 DIAGNOSIS — K5080 Crohn's disease of both small and large intestine without complications: Secondary | ICD-10-CM

## 2020-03-25 DIAGNOSIS — K633 Ulcer of intestine: Secondary | ICD-10-CM

## 2020-03-25 DIAGNOSIS — E559 Vitamin D deficiency, unspecified: Secondary | ICD-10-CM

## 2020-03-25 DIAGNOSIS — K5000 Crohn's disease of small intestine without complications: Secondary | ICD-10-CM

## 2020-03-25 MED ORDER — SODIUM CHLORIDE 0.9 % IV SOLN: 500.0000 mL | Freq: Once | INTRAVENOUS | Status: DC

## 2020-03-25 NOTE — Patient Instructions (Signed)
Impression/Recommendations:  Resume previous diet. Continue present medications.  Lab work today.  Clinic visit with Dr. Loletha Carrow in 4-6 weeks.  YOU HAD AN ENDOSCOPIC PROCEDURE TODAY AT [facility]:   Refer to the procedure report that was given to you for any specific questions about what was found during the examination.  If the procedure report does not answer your questions, please call your gastroenterologist to clarify.  If you requested that your care partner not be given the details of your procedure findings, then the procedure report has been included in a sealed envelope for you to review at your convenience later.  YOU SHOULD EXPECT: Some feelings of bloating in the abdomen. Passage of more gas than usual.  Walking can help get rid of the air that was put into your GI tract during the procedure and reduce the bloating. If you had a lower endoscopy (such as a colonoscopy or flexible sigmoidoscopy) you may notice spotting of blood in your stool or on the toilet paper. If you underwent a bowel prep for your procedure, you may not have a normal bowel movement for a few days.  Please Note:  You might notice some irritation and congestion in your nose or some drainage.  This is from the oxygen used during your procedure.  There is no need for concern and it should clear up in a day or so.  SYMPTOMS TO REPORT IMMEDIATELY:   Following lower endoscopy (colonoscopy or flexible sigmoidoscopy):  Excessive amounts of blood in the stool  Significant tenderness or worsening of abdominal pains  Swelling of the abdomen that is new, acute  Fever of 100F or higher  For urgent or emergent issues, a gastroenterologist can be reached at any hour by calling [phone]. Do not use MyChart messaging for urgent concerns.    DIET:  We do recommend a small meal at first, but then you may proceed to your regular diet.  Drink plenty of fluids but you should avoid alcoholic beverages for  24 hours.  ACTIVITY:  You should plan to take it easy for the rest of today and you should NOT DRIVE or use heavy machinery until tomorrow (because of the sedation medicines used during the test).    FOLLOW UP: Our staff will call the number listed on your records 48-72 hours following your procedure to check on you and address any questions or concerns that you may have regarding the information given to you following your procedure. If we do not reach you, we will leave a message.  We will attempt to reach you two times.  During this call, we will ask if you have developed any symptoms of COVID 19. If you develop any symptoms (ie: fever, flu-like symptoms, shortness of breath, cough etc.) before then, please call [phone].  If you test positive for Covid 19 in the 2 weeks post procedure, please call and report this information to [place].    If any biopsies were taken you will be contacted by phone or by letter within the next 1-3 weeks.  Please call us at [phone] if you have not heard about the biopsies in 3 weeks.    SIGNATURES/CONFIDENTIALITY: You and/or your care partner have signed paperwork which will be entered into your electronic medical record.  These signatures attest to the fact that that the information above on your After Visit Summary has been reviewed and is understood.  Full responsibility of the confidentiality of this discharge information lies with you  and/or your care-partner.

## 2020-03-25 NOTE — Op Note (Signed)
[facility] Patient Name: Jacob Mack Procedure Date: 03/25/2020 2:54 PM MRN: [id number] Endoscopist: Mallie Mussel L. Loletha Carrow , MD Age: 25 Referring MD:  Date of Birth: Feb 25, 1995 Gender: Male Account #: [id number] Procedure:                Colonoscopy Indications:              Disease activity assessment of Crohn's disease of                            the small bowel Medicines:                Monitored Anesthesia Care Procedure:                Pre-Anesthesia Assessment:                           - Prior to the procedure, a History and Physical                            was performed, and patient medications and                            allergies were reviewed. The patient's tolerance of                            previous anesthesia was also reviewed. The risks                            and benefits of the procedure and the sedation                            options and risks were discussed with the patient.                            All questions were answered, and informed consent                            was obtained. Prior Anticoagulants: The patient has                            taken no previous anticoagulant or antiplatelet                            agents. ASA Grade Assessment: II - A patient with                            mild systemic disease. After reviewing the risks                            and benefits, the patient was deemed in                            satisfactory condition to undergo the procedure.  After obtaining informed consent, the colonoscope                            was passed under direct vision. Throughout the                            procedure, the patient's blood pressure, pulse, and                            oxygen saturations were monitored continuously. The                            Olympus CF-HQ190L [id number]) Colonoscope was                            introduced through the anus and advanced to the the                             ileocolonic anastomosis, then the neo-TI. The                            colonoscopy was performed without difficulty. The                            patient tolerated the procedure well. The quality                            of the bowel preparation was fair. The rectum and                            Neo-terminal ileum and ileo-colonic anastomosis                            were photographed. Scope In: 3:03:20 PM Scope Out: 3:14:50 PM Scope Withdrawal Time: 0 hours 8 minutes 26 seconds  Total Procedure Duration: 0 hours 11 minutes 30 seconds  Findings:                 The perianal and digital rectal examinations were                            normal. Specifically, no peri-anal Crohn's activity                            was seen.                           The neo-terminal ileum contained multiple ulcers                            with inflamed mucosa (Rutgeert's i3 activity).                           There was evidence of a prior end-to-side  ileo-colonic anastomosis in the ascending colon.                            This was patent and was characterized by healthy                            appearing mucosa. The anastomosis was traversed                            with mild scope pressure..                           The exam was otherwise normal throughout the                            examined colon.                           The retroflexed view of the distal rectum and anal                            verge was normal and showed no anal or rectal                            abnormalities. Complications:            No immediate complications. Estimated Blood Loss:     Estimated blood loss: none. Impression:               - Preparation of the colon was fair.                           - Multiple ulcers in the neo-terminal ileum.                           - Patent end-to-side ileo-colonic anastomosis,                             characterized by healthy appearing mucosa.                           - The distal rectum and anal verge are normal on                            retroflexion view.                           - No specimens collected. Recommendation:           - Patient has a contact number available for                            emergencies. The signs and symptoms of potential                            delayed complications were discussed with the  patient. Return to normal activities tomorrow.                            Written discharge instructions were provided to the                            patient.                           - Resume previous diet.                           - Continue present medications.                           - No recommendation at this time regarding repeat                            colonoscopy.                           - TPMT enzyme activity (in anticipation of                            beginning azathioprine) and 1-25 [place] Vitamin D - lab                            draw today.                           1 to 2 days before next Stelara injection, draw                            Stelara drug and antibody level.                           Clinic visit with me in 4-6 weeks. Myles Mallicoat L. Loletha Carrow, MD 03/25/2020 3:29:59 PM This report has been signed electronically.

## 2020-03-25 NOTE — Progress Notes (Signed)
Report given to PACU, vss 

## 2020-03-25 NOTE — Progress Notes (Signed)
VS by NS  I have reviewed the patient's medical history in detail and updated the computerized patient record.

## 2020-03-26 ENCOUNTER — Telehealth: Payer: Self-pay

## 2020-03-26 ENCOUNTER — Other Ambulatory Visit: Payer: Self-pay

## 2020-03-26 DIAGNOSIS — K5080 Crohn's disease of both small and large intestine without complications: Secondary | ICD-10-CM

## 2020-03-26 NOTE — Telephone Encounter (Signed)
-----   Message from Doran Stabler, MD sent at 03/25/2020  5:15 PM EST ----- Colonoscopy done today for Crohn's disease.  Patient needs a Stelara (ustekinumab) drug and antibody trough level drawn 1-2 days before his next scheduled treatment.  Please check with him, because I think it will be in roughly 6 weeks.  He also needs a clinic appointment with me in 4-6 weeks.  - HD

## 2020-03-26 NOTE — Telephone Encounter (Signed)
Pt is returning a missed call from the nurse.

## 2020-03-26 NOTE — Telephone Encounter (Signed)
Follow up scheduled for 05/01/20 at 3:40 pm  Lab order entered.

## 2020-03-26 NOTE — Telephone Encounter (Signed)
Left message on machine to call back  

## 2020-03-26 NOTE — Telephone Encounter (Signed)
Left message for patient that we found the necklace that he left here after his procedure.  Placed necklace in a bag and left it at the front desk for him to pick up at his convenience.

## 2020-03-26 NOTE — Telephone Encounter (Signed)
Placed another call to the pt and went to voice mail. I left a message to call back and I have sent the information to My Chart.

## 2020-03-27 ENCOUNTER — Telehealth: Payer: Self-pay

## 2020-03-27 ENCOUNTER — Telehealth: Payer: Self-pay | Admitting: *Deleted

## 2020-03-27 NOTE — Telephone Encounter (Signed)
Pt reports he is doing fine

## 2020-03-27 NOTE — Telephone Encounter (Signed)
Spoke with patient in regards to Dr. Loletha Carrow recommendations. Patient is aware that he will need to come in for lab work 1-2 days before next Stelara dose. He is aware that no appt is necessary and he can stop by at his convenience between 7:30 AM - 5 PM. Patient is aware of scheduled follow up appt and that this information has been sent to his My Chart as well. Patient verbalized understanding and had no concerns at the end of the call.   Reminder in epic for lab work.

## 2020-03-27 NOTE — Telephone Encounter (Signed)
No answer, left message to call back later today, B.Lakishia Bourassa RN. 

## 2020-03-27 NOTE — Telephone Encounter (Signed)
No answer for post procedure call back, Left message.

## 2020-03-29 ENCOUNTER — Encounter: Payer: Self-pay | Admitting: Gastroenterology

## 2020-04-06 LAB — VITAMIN D 1,25 DIHYDROXY
Vitamin D 1, 25 (OH)2 Total: 39 pg/mL (ref 18–72)
Vitamin D2 1, 25 (OH)2: <8 pg/mL
Vitamin D3 1, 25 (OH)2: 39 pg/mL

## 2020-04-06 LAB — THIOPURINE METHYLTRANSFERASE (TPMT), RBC: Thiopurine Methyltransferase, RBC: 13 nmol/hr/mL RBC

## 2020-04-16 ENCOUNTER — Other Ambulatory Visit: Payer: Self-pay | Admitting: Gastroenterology

## 2020-04-16 ENCOUNTER — Other Ambulatory Visit: Payer: Self-pay | Admitting: Emergency Medicine

## 2020-04-16 DIAGNOSIS — J4521 Mild intermittent asthma with (acute) exacerbation: Secondary | ICD-10-CM

## 2020-04-16 MED ORDER — ALBUTEROL SULFATE (2.5 MG/3ML) 0.083% IN NEBU: INHALATION_SOLUTION | RESPIRATORY_TRACT | 2 refills | Status: DC

## 2020-04-16 NOTE — Telephone Encounter (Signed)
Medication Refill - Medication:   albuterol (PROVENTIL) (2.5 MG/3ML) 0.083% nebulizer solution   Has the patient contacted their pharmacy? Yes.  contact pcp.  (Agent: If no, request that the patient contact the pharmacy for the refill.) (Agent: If yes, when and what did the pharmacy advise?)  Preferred Pharmacy (with phone number or street name):   [facility] [place] [place] [address]  [place] [address]  Phone: [phone] Fax: [phone]     Agent: Please be advised that RX refills may take up to 3 business days. We ask that you follow-up with your pharmacy.

## 2020-04-30 ENCOUNTER — Telehealth: Payer: Self-pay

## 2020-04-30 NOTE — Telephone Encounter (Signed)
-----   Message from Yevette Edwards, RN sent at 03/27/2020 12:07 PM EST ----- Regarding: Labs Stelara drug and antibody level 1-2 days prior to next injection.

## 2020-04-30 NOTE — Telephone Encounter (Signed)
Left detailed message for patient letting him know that he is due for lab work about a day to 2 prior to his next Stelara injection. Advised patient that no appt is necessary and he can stop by at his convenience between 7:30 AM - 5 PM, Monday through Friday. Advised patient to give [place] a call if he has any questions.

## 2020-05-01 ENCOUNTER — Other Ambulatory Visit: Payer: Self-pay

## 2020-05-01 ENCOUNTER — Ambulatory Visit (INDEPENDENT_AMBULATORY_CARE_PROVIDER_SITE_OTHER): Payer: BC Managed Care – PPO | Admitting: Gastroenterology

## 2020-05-01 ENCOUNTER — Encounter: Payer: Self-pay | Admitting: Gastroenterology

## 2020-05-01 VITALS — BP 112/64 | HR 69 | Ht 66.0 in | Wt 154.0 lb

## 2020-05-01 DIAGNOSIS — Z79899 Other long term (current) drug therapy: Secondary | ICD-10-CM

## 2020-05-01 DIAGNOSIS — R140 Abdominal distension (gaseous): Secondary | ICD-10-CM

## 2020-05-01 DIAGNOSIS — K529 Noninfective gastroenteritis and colitis, unspecified: Secondary | ICD-10-CM

## 2020-05-01 DIAGNOSIS — K5000 Crohn's disease of small intestine without complications: Secondary | ICD-10-CM

## 2020-05-01 MED ORDER — AZATHIOPRINE 50 MG PO TABS: 50.0000 mg | ORAL_TABLET | Freq: Every day | ORAL | 0 refills | Status: AC

## 2020-05-01 NOTE — Patient Instructions (Signed)
If you are age 26 or older, your body mass index should be between 23-30. Your Body mass index is 24.86 kg/m. If this is out of the aforementioned range listed, please consider follow up with your Primary Care Provider.  If you are age 84 or younger, your body mass index should be between 19-25. Your Body mass index is 24.86 kg/m. If this is out of the aformentioned range listed, please consider follow up with your Primary Care Provider.   Start Azathioprine 50 mg once a day.   Don't forget to go to the lab next week!  Then do routine labs in 2 weeks from starting the Azathioprine   It was a pleasure to see you today!  Dr. Loletha Carrow

## 2020-05-01 NOTE — Progress Notes (Signed)
[facility] Progress Note  Chief Complaint: Ileal Crohn's disease  Subjective  History:  Jacob Mack was here for follow-up of his Crohn's disease.  He is feeling generally well and similar to before.  He still has some bloating and intermittent feelings of constipation.  Gassiness at times.  His appetite is good and his weight stable.  He denies nausea vomiting early satiety.  He believes he is due for Stelara sometime within the next week because he got a reminder to have it shipped.  He is going to do that at the end of the weekend since he is going to [place] this weekend to find an apartment for his new job after graduation. He will then go to our lab the day before he gives himself the next injection to check the Stelara drug and antibody levels as ordered.  Findings from February 21 colonoscopy: "The perianal and digital rectal examinations were normal. Specifically, no peri-anal Crohn's activity was seen. Findings: - The neo-terminal ileum contained multiple ulcers with inflamed mucosa (Rutgeert's i3 activity). - There was evidence of a prior end-to-side ileo-colonic anastomosis in the ascending colon. This was patent and was characterized by healthy appearing mucosa. The anastomosis was traversed with mild scope pressure.. - The exam was otherwise normal throughout the examined colon. - The retroflexed view of the distal rectum and anal verge was normal and showed no anal or rectal abnormalities."  The plan was for a TPMT level, which came back within normal level as noted below.,  And for a lab draw of Stelara drug and antibody levels just before next treatment.  ROS: Cardiovascular:  no chest pain Respiratory: no dyspnea No rash The patient's Past Medical, Family and Social History were reviewed and are on file in the EMR.  Objective:  Med list reviewed  Current Outpatient Medications:  .  albuterol (PROVENTIL) (2.5 MG/3ML) 0.083% nebulizer solution, INHALE  CONTENTS OF ONE NEBULE VIA NEBULIZER EVERY 6 HOURS AS NEEDED FOR WHEEZE OR SHORTNESS OF BREATH, Disp: 180 mL, Rfl: 2 .  azaTHIOprine (IMURAN) 50 MG tablet, Take 1 tablet (50 mg total) by mouth daily., Disp: 30 tablet, Rfl: 0 .  cetirizine (ZYRTEC) 10 MG tablet, Take 10 mg by mouth daily., Disp: , Rfl:  .  montelukast (SINGULAIR) 10 MG tablet, Take 1 tablet (10 mg total) by mouth at bedtime., Disp: 90 tablet, Rfl: 3 .  omeprazole (PRILOSEC) 40 MG capsule, TAKE 1 CAPSULE(40 MG) BY MOUTH TWICE DAILY, Disp: 60 capsule, Rfl: 1 .  STELARA 90 MG/ML SOSY injection, MAINTENANCE: INJECT 1 SYRINGE SUBCUTANEOUSLY EVERY 8 WEEKS. REFRIGERATE. DO NOT FREEZE., Disp: 1 mL, Rfl: 1 .  VITAMIN D PO, Take 1 capsule by mouth daily., Disp: , Rfl:    Vital signs in last 24 hrs: Vitals:   05/01/20 1543  BP: 112/64  Pulse: 69  SpO2: 98%   Wt Readings from Last 3 Encounters:  05/01/20 154 lb (69.9 kg)  03/25/20 154 lb (69.9 kg)  03/07/20 154 lb (69.9 kg)    Physical Exam  Well-appearing  HEENT: sclera anicteric, oral mucosa moist without lesions  Neck: supple, no thyromegaly, JVD or lymphadenopathy  Cardiac: RRR without murmurs, S1S2 heard, no peripheral edema  Pulm: clear to auscultation bilaterally, normal RR and effort noted  Abdomen: soft, no tenderness, with active bowel sounds. No guarding or palpable hepatosplenomegaly.  Skin; warm and dry, no jaundice or rash  Labs:  TPMT normal at 13 ___________________________________________ Radiologic studies:   ____________________________________________ Other:  _____________________________________________ Assessment & Plan  Assessment: Encounter Diagnoses  Name Primary?  . Crohn's disease of small intestine without complication ([place]) Yes  . Chronic diarrhea   . Abdominal bloating   . Long term current use of immunosuppressive drug    Millie's Crohn's disease needs improved control as evidenced on recent colonoscopy.  Drug and antibody  levels will help determine if Stelara can still be used and whether we may need to shorten the interval to every 6 or every 4 weeks.  I had an initial discussion with him about azathioprine both before and after his recent colonoscopy.  I had a long discussion about it with him today and recommended that he begin that medicine in the near future with plans to slowly increase it as tolerated and based on lab results. We discussed the added immunosuppressive risks, chances of pancreatitis, arthralgias, nonmelanoma skin cancer and the need for annual dermatologic exam for that.  He is agreeable and wants to get that started prior to his move out of state.  Prescription was sent for azathioprine 50 mg once daily, which he will start within the next week.  He will then have a CBC/differential and hepatic function panel 10 to 14 days later with slow dose escalations based on lab results and whether or not he is having side effects. I will start this process and most likely stop at about 100 mg a day because by then he will probably be getting ready to leave the area for his new job.  I had previously asked him to identify GI practice near his new home or work so he can set up a new patient appointment with them and be seen shortly after arrival there.  With that practice information we can also forward his records and he can access them on my chart as well.  I reminded him of that again today, and he says he will do that within the next week.  It will be especially important that he does not have a gap in care since he will be on a new medicine in the way of azathioprine and possibly changing his Stelara regimen.  Adilson will follow up with me in clinic in about 6 weeks, which will most likely be his last visit prior to leaving the area.  32 minutes were spent on this encounter (including chart review, history/exam, counseling/coordination of care, and documentation) > 50% of that time was spent on  counseling and coordination of care.  Topics discussed included: See above.  Nelida Meuse III

## 2020-05-07 ENCOUNTER — Other Ambulatory Visit (INDEPENDENT_AMBULATORY_CARE_PROVIDER_SITE_OTHER): Payer: BC Managed Care – PPO

## 2020-05-07 DIAGNOSIS — K529 Noninfective gastroenteritis and colitis, unspecified: Secondary | ICD-10-CM

## 2020-05-07 DIAGNOSIS — Z79899 Other long term (current) drug therapy: Secondary | ICD-10-CM | POA: Diagnosis not present

## 2020-05-07 DIAGNOSIS — K5000 Crohn's disease of small intestine without complications: Secondary | ICD-10-CM

## 2020-05-07 DIAGNOSIS — Z7960 Long term (current) use of unspecified immunomodulators and immunosuppressants: Secondary | ICD-10-CM

## 2020-05-07 DIAGNOSIS — R140 Abdominal distension (gaseous): Secondary | ICD-10-CM

## 2020-05-07 DIAGNOSIS — K5080 Crohn's disease of both small and large intestine without complications: Secondary | ICD-10-CM

## 2020-05-07 LAB — CBC WITH DIFFERENTIAL/PLATELET
Basophils Absolute: 0 10*3/uL (ref 0.0–0.1)
Basophils Relative: 0.7 % (ref 0.0–3.0)
Eosinophils Absolute: 0.3 10*3/uL (ref 0.0–0.7)
Eosinophils Relative: 4.6 % (ref 0.0–5.0)
HCT: 43.5 % (ref 39.0–52.0)
Hemoglobin: 14.7 g/dL (ref 13.0–17.0)
Lymphocytes Relative: 16.6 % (ref 12.0–46.0)
Lymphs Abs: 1.1 10*3/uL (ref 0.7–4.0)
MCHC: 33.9 g/dL (ref 30.0–36.0)
MCV: 89.2 fl (ref 78.0–100.0)
Monocytes Absolute: 0.4 10*3/uL (ref 0.1–1.0)
Monocytes Relative: 6.5 % (ref 3.0–12.0)
Neutro Abs: 4.8 10*3/uL (ref 1.4–7.7)
Neutrophils Relative %: 71.6 % (ref 43.0–77.0)
Platelets: 305 10*3/uL (ref 150.0–400.0)
RBC: 4.87 Mil/uL (ref 4.22–5.81)
RDW: 13.5 % (ref 11.5–15.5)
WBC: 6.6 10*3/uL (ref 4.0–10.5)

## 2020-05-07 LAB — HEPATIC FUNCTION PANEL
ALT: 15 U/L (ref 0–53)
AST: 29 U/L (ref 0–37)
Albumin: 4 g/dL (ref 3.5–5.2)
Alkaline Phosphatase: 78 U/L (ref 39–117)
Bilirubin, Direct: 0 mg/dL (ref 0.0–0.3)
Total Bilirubin: 0.4 mg/dL (ref 0.2–1.2)
Total Protein: 7.3 g/dL (ref 6.0–8.3)

## 2020-05-10 ENCOUNTER — Other Ambulatory Visit: Payer: Self-pay

## 2020-05-10 DIAGNOSIS — K529 Noninfective gastroenteritis and colitis, unspecified: Secondary | ICD-10-CM

## 2020-05-10 DIAGNOSIS — Z7960 Long term (current) use of unspecified immunomodulators and immunosuppressants: Secondary | ICD-10-CM

## 2020-05-10 DIAGNOSIS — K5000 Crohn's disease of small intestine without complications: Secondary | ICD-10-CM

## 2020-05-10 DIAGNOSIS — R140 Abdominal distension (gaseous): Secondary | ICD-10-CM

## 2020-05-10 DIAGNOSIS — Z79899 Other long term (current) drug therapy: Secondary | ICD-10-CM

## 2020-05-15 ENCOUNTER — Other Ambulatory Visit: Payer: Self-pay | Admitting: Nurse Practitioner

## 2020-05-18 LAB — USTEKINUMAB AND ANTI-USTEK AB
Anti-Ustekinumab Antibody: <40 ng/mL
Ustekinumab: 2.6 ug/mL

## 2020-05-24 ENCOUNTER — Telehealth: Payer: Self-pay

## 2020-05-24 NOTE — Telephone Encounter (Signed)
Lab reminder sent to patient via My Chart

## 2020-05-24 NOTE — Telephone Encounter (Signed)
-----   Message from Yevette Edwards, RN sent at 05/10/2020 12:16 PM EDT ----- Regarding: Labs Repeat CBC w/ Diff and hepatic function panel. Orders in epic.

## 2020-05-27 NOTE — Telephone Encounter (Signed)
Lm on vm for patient to return call 

## 2020-05-28 ENCOUNTER — Other Ambulatory Visit: Payer: Self-pay | Admitting: Gastroenterology

## 2020-05-29 NOTE — Telephone Encounter (Signed)
Lm on vm for patient to return call 

## 2020-05-29 NOTE — Telephone Encounter (Signed)
Spoke with patient to remind him that he is due for repeat lab work. Advised that he can stop by the lab at his convenience, he states that he will try to come by today before work. Patient had concerns in regards to refills for medications that his PCP prescribed. He states that [place] primary care closed down, advised that Dr. Mitchel Honour is at [facility] and he will need to contact them to set up an appt with him for his refills. Patient is aware that we will contact him with the lab results via my chart or a call. Patient  verbalized understanding and had no concerns at the end of the call.

## 2020-05-31 ENCOUNTER — Other Ambulatory Visit (INDEPENDENT_AMBULATORY_CARE_PROVIDER_SITE_OTHER): Payer: BC Managed Care – PPO

## 2020-05-31 DIAGNOSIS — K529 Noninfective gastroenteritis and colitis, unspecified: Secondary | ICD-10-CM

## 2020-05-31 DIAGNOSIS — Z79899 Other long term (current) drug therapy: Secondary | ICD-10-CM

## 2020-05-31 DIAGNOSIS — K5000 Crohn's disease of small intestine without complications: Secondary | ICD-10-CM | POA: Diagnosis not present

## 2020-05-31 DIAGNOSIS — R140 Abdominal distension (gaseous): Secondary | ICD-10-CM

## 2020-05-31 LAB — CBC WITH DIFFERENTIAL/PLATELET
Basophils Absolute: 0.1 10*3/uL (ref 0.0–0.1)
Basophils Relative: 0.6 % (ref 0.0–3.0)
Eosinophils Absolute: 0.1 10*3/uL (ref 0.0–0.7)
Eosinophils Relative: 1.5 % (ref 0.0–5.0)
HCT: 42.8 % (ref 39.0–52.0)
Hemoglobin: 14.6 g/dL (ref 13.0–17.0)
Lymphocytes Relative: 16.1 % (ref 12.0–46.0)
Lymphs Abs: 1.4 10*3/uL (ref 0.7–4.0)
MCHC: 34.1 g/dL (ref 30.0–36.0)
MCV: 89.5 fl (ref 78.0–100.0)
Monocytes Absolute: 0.6 10*3/uL (ref 0.1–1.0)
Monocytes Relative: 6.5 % (ref 3.0–12.0)
Neutro Abs: 6.6 10*3/uL (ref 1.4–7.7)
Neutrophils Relative %: 75.3 % (ref 43.0–77.0)
Platelets: 320 10*3/uL (ref 150.0–400.0)
RBC: 4.78 Mil/uL (ref 4.22–5.81)
RDW: 13.6 % (ref 11.5–15.5)
WBC: 8.8 10*3/uL (ref 4.0–10.5)

## 2020-05-31 LAB — HEPATIC FUNCTION PANEL
ALT: 18 U/L (ref 0–53)
AST: 20 U/L (ref 0–37)
Albumin: 4.5 g/dL (ref 3.5–5.2)
Alkaline Phosphatase: 88 U/L (ref 39–117)
Bilirubin, Direct: 0.1 mg/dL (ref 0.0–0.3)
Total Bilirubin: 0.6 mg/dL (ref 0.2–1.2)
Total Protein: 8 g/dL (ref 6.0–8.3)

## 2020-06-19 ENCOUNTER — Encounter: Payer: Self-pay | Admitting: Gastroenterology

## 2020-06-19 ENCOUNTER — Ambulatory Visit: Payer: BC Managed Care – PPO | Admitting: Gastroenterology

## 2020-06-19 VITALS — BP 100/70 | HR 64 | Ht 66.0 in | Wt 152.6 lb

## 2020-06-19 DIAGNOSIS — Z79899 Other long term (current) drug therapy: Secondary | ICD-10-CM

## 2020-06-19 DIAGNOSIS — K5000 Crohn's disease of small intestine without complications: Secondary | ICD-10-CM

## 2020-06-19 NOTE — Patient Instructions (Addendum)
If you are age 26 or older, your body mass index should be between 23-30. Your Body mass index is 24.63 kg/m. If this is out of the aforementioned range listed, please consider follow up with your Primary Care Provider.  If you are age 2 or younger, your body mass index should be between 19-25. Your Body mass index is 24.63 kg/m. If this is out of the aformentioned range listed, please consider follow up with your Primary Care Provider.   Please let us know who your new GI doctor will be in [place]!   It was a pleasure to see you today!  Thank you for trusting me with your gastrointestinal care!

## 2020-06-19 NOTE — Progress Notes (Signed)
[facility] Progress Note  Chief Complaint: Ileal Crohn's disease  Subjective  History: Jacob Mack follows up for his Crohn's disease, he is on combination therapy with Stelara 90 mg every 8 weeks and recently began azathioprine.  After his April 29 labs, I recommended increasing AZA from 50 mg daily to 100 mg daily.  However, Jacob Mack tells me he does not seem to gotten that message and is still on 50 mg once daily.  He opened his MyChart and saw that it been logged out, and when he logged back in showed him under lab results where my message was attached.  He is feeling well, denies abdominal pain, diarrhea or rectal bleeding.  He denies episodes of painful eye redness, joint swelling or rash. He is moving to [place] in just under 2 weeks for his new job and computer work, and he is both excited and anxious about it.  He still has not identified a GI practice there, says he got sidetracked during final exams and getting ready to move. He had looked at a couple of them and thought he narrowed it down, so he says he will get it figured out later today, call to set an appointment, and then let us know so we can send records.  ROS: Cardiovascular:  no chest pain Respiratory: no dyspnea  The patient's Past Medical, Family and Social History were reviewed and are on file in the EMR.  Objective:  Med list reviewed  Current Outpatient Medications:  .  albuterol (PROVENTIL) (2.5 MG/3ML) 0.083% nebulizer solution, INHALE CONTENTS OF ONE NEBULE VIA NEBULIZER EVERY 6 HOURS AS NEEDED FOR WHEEZE OR SHORTNESS OF BREATH, Disp: 180 mL, Rfl: 2 .  azaTHIOprine (IMURAN) 50 MG tablet, Take 1 tablet (50 mg total) by mouth daily., Disp: 30 tablet, Rfl: 0 .  cetirizine (ZYRTEC) 10 MG tablet, Take 10 mg by mouth daily., Disp: , Rfl:  .  montelukast (SINGULAIR) 10 MG tablet, Take 1 tablet (10 mg total) by mouth at bedtime., Disp: 90 tablet, Rfl: 3 .  omeprazole (PRILOSEC) 40 MG capsule, TAKE 1 CAPSULE(40  MG) BY MOUTH TWICE DAILY, Disp: 180 capsule, Rfl: 1 .  STELARA 90 MG/ML SOSY injection, MAINTENANCE: INJECT 1 SYRINGE SUBCUTANEOUSLY EVERY 8 WEEKS. REFRIGERATE. DO NOT FREEZE., Disp: 1 mL, Rfl: 1 .  VITAMIN D PO, Take 1 capsule by mouth daily., Disp: , Rfl:    Vital signs in last 24 hrs: Vitals:   06/19/20 1510  BP: 100/70  Pulse: 64   Wt Readings from Last 3 Encounters:  06/19/20 152 lb 9.6 oz (69.2 kg)  05/01/20 154 lb (69.9 kg)  03/25/20 154 lb (69.9 kg)    Physical Exam  Well-appearing  HEENT: sclera anicteric, oral mucosa moist without lesions  Neck: supple, no thyromegaly, JVD or lymphadenopathy  Cardiac: RRR without murmurs, S1S2 heard, no peripheral edema  Pulm: clear to auscultation bilaterally, normal RR and effort noted  Abdomen: soft, no tenderness, with active bowel sounds. No guarding or palpable hepatosplenomegaly.  Skin; warm and dry, no jaundice or rash  Labs:  On 05/07/2020: Stelara level 2.6, negative antibodies -no change in dosing.  TPMT level measured in February was normal at 13  CBC Latest Ref Rng & Units 05/31/2020 05/07/2020 01/10/2020  WBC 4.0 - 10.5 K/uL 8.8 6.6 7.4  Hemoglobin 13.0 - 17.0 g/dL 14.6 14.7 14.4  Hematocrit 39.0 - 52.0 % 42.8 43.5 42.7  Platelets 150.0 - 400.0 K/uL 320.0 305.0 311.0   CMP Latest Ref  Rng & Units 05/31/2020 05/07/2020 01/10/2020  Glucose 70 - 99 mg/dL - - 95  BUN 6 - 23 mg/dL - - 10  Creatinine 0.40 - 1.50 mg/dL - - 1.24  Sodium 135 - 145 mEq/L - - 137  Potassium 3.5 - 5.1 mEq/L - - 3.7  Chloride 96 - 112 mEq/L - - 101  CO2 19 - 32 mEq/L - - 30  Calcium 8.4 - 10.5 mg/dL - - 9.4  Total Protein 6.0 - 8.3 g/dL 8.0 7.3 7.5  Total Bilirubin 0.2 - 1.2 mg/dL 0.6 0.4 0.4  Alkaline Phos 39 - 117 U/L 88 78 76  AST 0 - 37 U/L 20 29 16   ALT 0 - 53 U/L 18 15 12     ___________________________________________ Radiologic  studies:   ____________________________________________ Other:   _____________________________________________ Assessment & Plan  Assessment: Encounter Diagnoses  Name Primary?  . Crohn's disease of small intestine without complication ([place]) Yes  . Long term current use of immunosuppressive drug     Currently good symptomatic control with Stelara and low-dose azathioprine.  Colonoscopy showed there was room for improvement on endoscopic control. C. difficile earlier this year that it caused a flare of symptoms.  Plan: I have decided not to increase the azathioprine right now because he is moving soon and I cannot arrange the necessary lab work.  Risks of azathioprine again reviewed including pancreatitis, additional immunosuppression, and increased risk of nonmelanoma skin cancer with need for annual dermatologic exam.  As noted above, I strongly encouraged him to identify GI practice in [place], set an appointment since there is likely to be a waiting period for new patients, and then inform us of the practice so we can send his records along.  20 minutes were spent on this encounter (including chart review, history/exam, counseling/coordination of care, and documentation) > 50% of that time was spent on counseling and coordination of care.  Topics discussed included: See above.  Nelida Meuse III

## 2020-06-24 ENCOUNTER — Ambulatory Visit: Payer: BC Managed Care – PPO | Admitting: Internal Medicine

## 2020-06-24 ENCOUNTER — Encounter: Payer: Self-pay | Admitting: Internal Medicine

## 2020-06-24 DIAGNOSIS — D649 Anemia, unspecified: Secondary | ICD-10-CM | POA: Diagnosis not present

## 2020-06-24 DIAGNOSIS — K219 Gastro-esophageal reflux disease without esophagitis: Secondary | ICD-10-CM

## 2020-06-24 DIAGNOSIS — J45909 Unspecified asthma, uncomplicated: Secondary | ICD-10-CM | POA: Insufficient documentation

## 2020-06-24 DIAGNOSIS — J4521 Mild intermittent asthma with (acute) exacerbation: Secondary | ICD-10-CM | POA: Diagnosis not present

## 2020-06-24 DIAGNOSIS — J4520 Mild intermittent asthma, uncomplicated: Secondary | ICD-10-CM

## 2020-06-24 HISTORY — DX: Gastro-esophageal reflux disease without esophagitis: K21.9

## 2020-06-24 MED ORDER — CETIRIZINE HCL 10 MG PO TABS: 10.0000 mg | ORAL_TABLET | Freq: Every day | ORAL | 3 refills | Status: AC | PRN

## 2020-06-24 MED ORDER — ALBUTEROL SULFATE (2.5 MG/3ML) 0.083% IN NEBU: INHALATION_SOLUTION | RESPIRATORY_TRACT | 2 refills | Status: AC

## 2020-06-24 MED ORDER — MONTELUKAST SODIUM 10 MG PO TABS: 10.0000 mg | ORAL_TABLET | Freq: Every day | ORAL | 3 refills | Status: AC

## 2020-06-24 MED ORDER — BUDESONIDE-FORMOTEROL FUMARATE 80-4.5 MCG/ACT IN AERO: 2.0000 | INHALATION_SPRAY | Freq: Two times a day (BID) | RESPIRATORY_TRACT | 3 refills | Status: AC

## 2020-06-24 NOTE — Patient Instructions (Signed)
Please take all new medication as prescribed - the symbicort  Please continue all other medications as before, and refills have been done if requested.  Please have the pharmacy call with any other refills you may need.  Please continue your efforts at being more active, low cholesterol diet, and weight control.  Please keep your appointments with your specialists as you may have planned  Good Luck in [place]!

## 2020-06-24 NOTE — Assessment & Plan Note (Signed)
Mild uncontrolled, to add symbicort asd,  to f/u any worsening symptoms or concerns

## 2020-06-24 NOTE — Assessment & Plan Note (Signed)
Stable, cont current PPI

## 2020-06-24 NOTE — Assessment & Plan Note (Signed)
No recent overt bleeding, declines lab f/u today

## 2020-06-24 NOTE — Progress Notes (Signed)
Patient ID: Jacob Mack, male   DOB: 12-Oct-1995, 25 y.o.   MRN: [id number]        Chief Complaint: asthma, gerd, anemia       HPI:  Jacob Mack is a 25 y.o. male here with c/o asthma f/u, overall doing ok but has been using the albuterol neb more than twice per wk; not taking steroid controller; Pt denies chest pain, orthopnea, PND, increased LE swelling, palpitations, dizziness or syncope.   Pt denies fever, wt loss, night sweats, loss of appetite, or other constitutional symptoms   Pt denies polydipsia, polyuria, .  Denies worsening reflux, abd pain, dysphagia, n/v, bowel change or blood.  No overt bleeding.  Moving his home to [place] next wk.   Wt Readings from Last 3 Encounters:  06/24/20 155 lb (70.3 kg)  06/19/20 152 lb 9.6 oz (69.2 kg)  05/01/20 154 lb (69.9 kg)   BP Readings from Last 3 Encounters:  06/24/20 118/70  06/19/20 100/70  05/01/20 112/64         Past Medical History:  Diagnosis Date  . Anemia   . Asthma   . Bowel obstruction ([place])   . Crohn's colitis ([place]) 04/2011  . Duodenal ulcer   . Gastric ulcer   . GERD (gastroesophageal reflux disease) 06/24/2020   Past Surgical History:  Procedure Laterality Date  . COLON RESECTION    . COLONOSCOPY  02/13/2019  . ESOPHAGOGASTRODUODENOSCOPY  02/13/2019  . SMALL INTESTINE SURGERY  10/2017    reports that he has never smoked. He has never used smokeless tobacco. He reports current alcohol use. He reports that he does not use drugs. family history includes Asthma in his brother; Autism in his brother; Blindness in his paternal grandmother; Diabetes in his father, maternal grandmother, mother, and paternal grandmother. Allergies  Allergen Reactions  . Fish Allergy Anaphylaxis  . Peanut Oil Anaphylaxis and Swelling  . Tree Extract Swelling  . Augmentin [Amoxicillin-Pot Clavulanate] Nausea And Vomiting   Current Outpatient Medications on File Prior to Visit  Medication Sig Dispense Refill  . azaTHIOprine (IMURAN) 50 MG  tablet Take 1 tablet (50 mg total) by mouth daily. 30 tablet 0  . omeprazole (PRILOSEC) 40 MG capsule TAKE 1 CAPSULE(40 MG) BY MOUTH TWICE DAILY 180 capsule 1  . STELARA 90 MG/ML SOSY injection MAINTENANCE: INJECT 1 SYRINGE SUBCUTANEOUSLY EVERY 8 WEEKS. REFRIGERATE. DO NOT FREEZE. 1 mL 1  . VITAMIN D PO Take 1 capsule by mouth daily.     No current facility-administered medications on file prior to visit.        ROS:  All others reviewed and negative.  Objective        PE:  BP 118/70 (BP Location: Right Arm, Patient Position: Sitting, Cuff Size: Normal)   Pulse 96   Temp 98.3 F (36.8 C) (Oral)   Ht 5' 6"  (1.676 m)   Wt 155 lb (70.3 kg)   SpO2 97%   BMI 25.02 kg/m                 Constitutional: Pt appears in NAD               HENT: Head: NCAT.                Right Ear: External ear normal.                 Left Ear: External ear normal.  Eyes: . Pupils are equal, round, and reactive to light. Conjunctivae and EOM are normal               Nose: without d/c or deformity               Neck: Neck supple. Gross normal ROM               Cardiovascular: Normal rate and regular rhythm.                 Pulmonary/Chest: Effort normal and breath sounds without rales or wheezing.                Abd:  Soft, NT, ND, + BS, no organomegaly               Neurological: Pt is alert. At baseline orientation, motor grossly intact               Skin: Skin is warm. No rashes, no other new lesions, LE edema - none               Psychiatric: Pt behavior is normal without agitation   Micro: none  Cardiac tracings I have personally interpreted today:  none  Pertinent Radiological findings (summarize): none   Lab Results  Component Value Date   WBC 8.8 05/31/2020   HGB 14.6 05/31/2020   HCT 42.8 05/31/2020   PLT 320.0 05/31/2020   GLUCOSE 95 01/10/2020   CHOL 163 11/15/2018   TRIG 62 11/15/2018   HDL 54 11/15/2018   LDLCALC 97 11/15/2018   ALT 18 05/31/2020   AST 20 05/31/2020    NA 137 01/10/2020   K 3.7 01/10/2020   CL 101 01/10/2020   CREATININE 1.24 01/10/2020   BUN 10 01/10/2020   CO2 30 01/10/2020   HGBA1C 5.4 11/15/2018   Assessment/Plan:  Jacob Mack is a 25 y.o. Black or African American [2] male with  has a past medical history of Anemia, Asthma, Bowel obstruction ([place]), Crohn's colitis ([place]) (04/2011), Duodenal ulcer, Gastric ulcer, and GERD (gastroesophageal reflux disease) (06/24/2020).  Asthma Mild uncontrolled, to add symbicort asd,  to f/u any worsening symptoms or concerns  GERD (gastroesophageal reflux disease) Stable, cont current PPI   Anemia No recent overt bleeding, declines lab f/u today  Followup: Return if symptoms worsen or fail to improve.  Cathlean Cower, MD 06/24/2020 9:36 PM [facility]
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
## 2021-12-06 ENCOUNTER — Other Ambulatory Visit: Payer: Self-pay

## 2021-12-06 ENCOUNTER — Emergency Department (HOSPITAL_COMMUNITY): Payer: Self-pay

## 2021-12-06 ENCOUNTER — Encounter (HOSPITAL_COMMUNITY): Payer: Self-pay | Admitting: Emergency Medicine

## 2021-12-06 ENCOUNTER — Emergency Department (HOSPITAL_COMMUNITY)
Admission: EM | Admit: 2021-12-06 | Discharge: 2021-12-07 | Discharge status: Against Medical Advice | Payer: Self-pay | Attending: Emergency Medicine | Admitting: Emergency Medicine

## 2021-12-06 DIAGNOSIS — Z5321 Procedure and treatment not carried out due to patient leaving prior to being seen by health care provider: Secondary | ICD-10-CM | POA: Insufficient documentation

## 2021-12-06 DIAGNOSIS — W01198A Fall on same level from slipping, tripping and stumbling with subsequent striking against other object, initial encounter: Secondary | ICD-10-CM | POA: Insufficient documentation

## 2021-12-06 DIAGNOSIS — Y9289 Other specified places as the place of occurrence of the external cause: Secondary | ICD-10-CM | POA: Insufficient documentation

## 2021-12-06 DIAGNOSIS — R4182 Altered mental status, unspecified: Secondary | ICD-10-CM | POA: Insufficient documentation

## 2021-12-06 DIAGNOSIS — S0081XA Abrasion of other part of head, initial encounter: Secondary | ICD-10-CM | POA: Insufficient documentation

## 2021-12-06 DIAGNOSIS — Y998 Other external cause status: Secondary | ICD-10-CM | POA: Insufficient documentation

## 2021-12-06 DIAGNOSIS — Y9389 Activity, other specified: Secondary | ICD-10-CM | POA: Insufficient documentation
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
## 2021-12-08 ENCOUNTER — Telehealth: Payer: Self-pay | Admitting: *Deleted

## 2021-12-08 NOTE — Telephone Encounter (Signed)
Transition Care Management Unsuccessful Follow-up Telephone Call  Date of discharge and from where:  12/07/2021 [facility] ED   Attempts:  1st Attempt  Reason for unsuccessful TCM follow-up call:  Left voice message

## 2021-12-10 NOTE — Telephone Encounter (Signed)
Patient decline ED visit as he states he no longer lives in [place]
